# Patient Record
Sex: Female | Born: 1945 | Race: Black or African American | Hispanic: No | Marital: Married | Smoking: Former smoker
Health system: Southern US, Community
[De-identification: ages and names within clinical notes are randomized; demographics above are authoritative.]

## PROBLEM LIST (undated history)

## (undated) DIAGNOSIS — K219 Gastro-esophageal reflux disease without esophagitis: Secondary | ICD-10-CM

## (undated) DIAGNOSIS — M1990 Unspecified osteoarthritis, unspecified site: Secondary | ICD-10-CM

## (undated) DIAGNOSIS — F32A Depression, unspecified: Secondary | ICD-10-CM

## (undated) DIAGNOSIS — M549 Dorsalgia, unspecified: Secondary | ICD-10-CM

## (undated) DIAGNOSIS — F329 Major depressive disorder, single episode, unspecified: Secondary | ICD-10-CM

## (undated) DIAGNOSIS — E7800 Pure hypercholesterolemia, unspecified: Secondary | ICD-10-CM

## (undated) HISTORY — PX: NECK SURGERY: SHX720

## (undated) HISTORY — PX: CATARACT EXTRACTION: SUR2

## (undated) HISTORY — PX: ABDOMINAL HYSTERECTOMY: SHX81

## (undated) HISTORY — PX: BACK SURGERY: SHX140

---

## 2005-03-22 ENCOUNTER — Ambulatory Visit (HOSPITAL_COMMUNITY): Admission: RE | Admit: 2005-03-22 | Discharge: 2005-03-22 | Payer: Self-pay | Admitting: Family Medicine

## 2005-07-21 ENCOUNTER — Emergency Department (HOSPITAL_COMMUNITY): Admission: EM | Admit: 2005-07-21 | Discharge: 2005-07-21 | Payer: Self-pay | Admitting: Emergency Medicine

## 2005-07-26 ENCOUNTER — Emergency Department (HOSPITAL_COMMUNITY): Admission: EM | Admit: 2005-07-26 | Discharge: 2005-07-26 | Payer: Self-pay | Admitting: Emergency Medicine

## 2005-09-11 ENCOUNTER — Ambulatory Visit (HOSPITAL_COMMUNITY): Admission: RE | Admit: 2005-09-11 | Discharge: 2005-09-11 | Payer: Self-pay | Admitting: Orthopedic Surgery

## 2006-07-24 ENCOUNTER — Emergency Department (HOSPITAL_COMMUNITY): Admission: EM | Admit: 2006-07-24 | Discharge: 2006-07-24 | Payer: Self-pay | Admitting: Emergency Medicine

## 2009-09-23 ENCOUNTER — Ambulatory Visit (HOSPITAL_COMMUNITY): Admission: RE | Admit: 2009-09-23 | Discharge: 2009-09-23 | Payer: Self-pay | Admitting: Family Medicine

## 2009-10-27 ENCOUNTER — Encounter (INDEPENDENT_AMBULATORY_CARE_PROVIDER_SITE_OTHER): Payer: Self-pay | Admitting: *Deleted

## 2009-11-30 ENCOUNTER — Ambulatory Visit: Payer: Self-pay | Admitting: Gastroenterology

## 2009-11-30 DIAGNOSIS — R1013 Epigastric pain: Secondary | ICD-10-CM | POA: Insufficient documentation

## 2009-11-30 DIAGNOSIS — R630 Anorexia: Secondary | ICD-10-CM

## 2009-11-30 DIAGNOSIS — K219 Gastro-esophageal reflux disease without esophagitis: Secondary | ICD-10-CM

## 2009-11-30 DIAGNOSIS — R198 Other specified symptoms and signs involving the digestive system and abdomen: Secondary | ICD-10-CM

## 2009-11-30 DIAGNOSIS — R634 Abnormal weight loss: Secondary | ICD-10-CM

## 2009-11-30 DIAGNOSIS — Z85038 Personal history of other malignant neoplasm of large intestine: Secondary | ICD-10-CM | POA: Insufficient documentation

## 2009-12-22 ENCOUNTER — Telehealth (INDEPENDENT_AMBULATORY_CARE_PROVIDER_SITE_OTHER): Payer: Self-pay | Admitting: *Deleted

## 2010-01-06 ENCOUNTER — Ambulatory Visit (HOSPITAL_COMMUNITY): Admission: RE | Admit: 2010-01-06 | Discharge: 2010-01-06 | Payer: Self-pay | Admitting: Gastroenterology

## 2010-01-06 ENCOUNTER — Ambulatory Visit: Payer: Self-pay | Admitting: Gastroenterology

## 2010-01-12 ENCOUNTER — Encounter (INDEPENDENT_AMBULATORY_CARE_PROVIDER_SITE_OTHER): Payer: Self-pay

## 2010-04-07 ENCOUNTER — Encounter (INDEPENDENT_AMBULATORY_CARE_PROVIDER_SITE_OTHER): Payer: Self-pay | Admitting: *Deleted

## 2010-05-02 NOTE — Letter (Signed)
Summary: Patient Notice, Colon Biopsy Results  [facility]  [address]   [place], [place] [address]   Phone: [phone]  Fax: [phone]       January 12, 2010   [facility] [address] [place], [place]  [address] 1946/03/23    Dear Ms. Fayrene Fearing,  We have been unable to reach you by phone. I am pleased to inform you that the biopsies taken during your recent colonoscopy did not show any evidence of cancer upon pathologic examination. You did have some inflammation.  Additional information/recommendations:  __Continue taking your Prilosec, Benefiber and [facility]  __ Please follow a low fat / High fiber diet  __You should have a repeat colonoscopy examination  in 5 years.  Please call us if you are having persistent problems or have questions about your condition that have not been fully answered at this time.  Sincerely,    Hendricks Limes LPN  [facility] Ph: [phone]    Fax: [phone]

## 2010-05-02 NOTE — Progress Notes (Signed)
  Phone Note Call from Patient   Summary of Call: PT called to cx her procedure for tomorrow because she has had a death in the family and needs to [facility]. Initial call taken by: Diana Eves,  December 22, 2009 9:39 AM     Appended Document:  her number is [phone]  Appended Document:  I called pt to reschedule procedure 01/06/10@9 :00a.m.

## 2010-05-02 NOTE — Letter (Signed)
Summary: Unable to Reach, Consult Scheduled  [facility]  [address]   [place], [place] [address]   Phone: [phone]  Fax: [phone]    10/27/2009  [facility] [address] [place], [place]  [address] [birth year]   Dear Karina Holland,   We have been unable to reach you by phone.  Please contact our office with an updated phone number.  At the recommendation of [facility] we have been asked to schedule you a consult with DR FIELDS for ALTERNATING CONSTIPATION AND DIARRHEA, CONSULT FOR COLONOSCOPY.   Please call our office at [phone].     Thank you,    Karina Holland  [facility] R. Roetta Sessions, M.D.    Jonette Eva, M.D. Lorenza Burton, FNP-BC    Tana Coast, PA-C Phone: [phone]    Fax: [phone]

## 2010-05-02 NOTE — Letter (Signed)
Summary: TCS/EGD ORDER  TCS/EGD ORDER   Imported By: Ave Filter 11/30/2009 11:22:35  _____________________________________________________________________  External Attachment:    Type:   Image     Comment:   External Document

## 2010-05-02 NOTE — Assessment & Plan Note (Signed)
Summary: ABD PAIN,ALTERNATING DIARRHEA,CONSTIPATION,COLON CANCER,CONSU...   Visit Type:  Initial Consult Referring Provider:  Thurmond Butts Primary Care Provider:  Thurmond Butts  Chief Complaint:  Alternating diarrhea/constipation.  History of Present Illness: Karina Holland is a pleasant 65 y/o female, pateint of [name], who was referred for further evaluation of abd pain, change in stools, h/o colon cancer.  H/O colon cancer s/p resection done at [facility], 10-15 yrs ago. Not sure why she was never had another colonoscopy. Having abd pain for few months. They tried PPI at first but no better. C/O intermittent loss of appetite. States she lost from 150 to 120 pounds over the last one year but weight back up to 138 now. Some burning in throat in chest. This week started regurgitation and some vomiting. Stools are green. BM about twice per day. Stools are solid to loose. Sat/Sun real bad diarrhea. Then may have intermittent constipation as well and takes stool softner. Abd cramping and pain in mid to upper abd. No recent brbpr, melena. Eats lots of vegetables. C/O irritated hemorrhoids and associated pain.   CT A/P, 09/23/09-->tiny left renal cyst Labs 09/16/09: WBC 7500, H/H 13.1/40.4, Plt 283,000, Cre 1.21, Na 141, K 4.2, alb 4.2, Ca 9.3, Tbili 0.3, AP 73, AST 25, ALT 27.  Current Medications (verified): 1)  Multi-Vitamin .... Take 1 Tablet By Mouth Once A Day 2)  Ibuprofen 800 Mg Tabs (Ibuprofen) .... As Needed  Allergies (verified): No Known Drug Allergies  Past History:  Past Medical History: Colon cancer, around 2001, no f/u TCS done.  Cervical stenosis  Past Surgical History: Colon cancer surgery Hysterectomy for uterine cancer? Neck surgery  Family History: Mother, deceased, colon cancer Father, DM Sister, lung cancer  Social History: Separated. Three children.  Smokes about two cigs per day, trying to quit. No alcohol.  No drugs.  Review of  Systems General:  Complains of anorexia and weight loss; denies fever, chills, sweats, fatigue, and weakness. Eyes:  Denies vision loss. ENT:  Denies nasal congestion, sore throat, hoarseness, and difficulty swallowing. CV:  Denies chest pains, angina, palpitations, dyspnea on exertion, and peripheral edema. Resp:  Denies dyspnea at rest, dyspnea with exercise, cough, sputum, and wheezing. GI:  See HPI. GU:  Denies urinary burning and blood in urine. MS:  neck pain. Derm:  Denies rash and itching. Neuro:  Denies weakness, frequent headaches, memory loss, and confusion. Psych:  Complains of anxiety; denies depression. Endo:  Denies unusual weight change. Heme:  Denies bruising and bleeding. Allergy:  Denies hives and rash.  Vital Signs:  Patient profile:   65 year old female Height:      66 inches Weight:      138 pounds BMI:     22.35 Temp:     97.9 degrees F oral Pulse rate:   72 / minute BP sitting:   108 / 70  (left arm) Cuff size:   regular  Vitals Entered By: Cloria Spring LPN (November 30, 2009 10:19 AM)  Physical Exam  General:  Well developed, well nourished, no acute distress. Head:  Normocephalic and atraumatic. Eyes:  Conjunctivae pink, no scleral icterus.  Mouth:  Oropharyngeal mucosa moist, pink.  No lesions, erythema or exudate.    Neck:  Supple; no masses or thyromegaly. Lungs:  Clear throughout to auscultation. Heart:  Regular rate and rhythm; no murmurs, rubs,  or bruits. Abdomen:  Normal BS. Moderate epig tenderness. No rebound or guarding. No abd bruit or hernia. No HSM or  masses.  Rectal:  deferred until time of colonoscopy.   Extremities:  No clubbing, cyanosis, edema or deformities noted. Neurologic:  Alert and  oriented x4;  grossly normal neurologically. Skin:  Intact without significant lesions or rashes. Cervical Nodes:  No significant cervical adenopathy. Psych:  Alert and cooperative. Normal mood and affect.  Impression &  Recommendations:  Problem # 1:  ADENOCARCINOMA, COLON, HX OF (ICD-V10.05)  H/O remote colon cancer per patient. No subsequent colonoscopy. Now with change in stools with alternating constipation and diarrhea (mostly soft/loose stools).  Recent CT unrevealing. Labs good. Recommend colonoscopy. Colonoscopy to be performed in near future.  Risks, alternatives, and benefits including but not limited to the risk of reaction to medication, bleeding, infection, and perforation were addressed.  Patient voiced understanding and provided verbal consent.   C/O irritated hemorrhoids.  Orders: Consultation Level III (56213)  Problem # 2:  ABDOMINAL PAIN, EPIGASTRIC (ICD-789.06)  Epigastric pain for several months, associated with intermittent anorexia and weight loss, also with GERD symptoms. Trial of omeprazole and Nexium did not help. She has used intermittent NSAIDS including Voltaren and ibuprofen. EGD to be performed in near future.  Risks, alternatives, benefits including but not limited to risk of reaction to medications, bleeding, infection, and perforation addressed.  Patient voiced understanding and verbal consent obtained.   Orders: Consultation Level III (08657) Prescriptions: LIDOCAINE-HYDROCORTISONE ACE 3-0.5 % CREA (LIDOCAINE-HYDROCORTISONE ACE) apply anorectally two times a day for 14 days  #14 days x 0   Entered and Authorized by:   Leanna Battles. Dixon Boos   Signed by:   Leanna Battles Lewis PA-C on 11/30/2009   Method used:   Print then Give to Patient   RxID:   [id number]  I would like to thank Dixie Dials, NP for allowing [place] to take part in the care of this nice patient.  Appended Document: ABD PAIN,ALTERNATING DIARRHEA,CONSTIPATION,COLON CANCER,CONSU... NEEDS PED SCOPE.  Appended Document: ABD PAIN,ALTERNATING DIARRHEA,CONSTIPATION,COLON CANCER,CONSU... CM added peds scope to order  Appended Document: ABD PAIN,ALTERNATING DIARRHEA,CONSTIPATION,COLON CANCER,CONSU... JUNE 2011:  BMI 21.15 135 LBS ETOH? [facility] NOTES YES/ [facility] NO

## 2010-05-04 NOTE — Letter (Signed)
Summary: Recall Office Visit  [facility]  [address]   [place], [place] [address]   Phone: [phone]  Fax: [phone]      April 07, 2010   [facility] [address] [place], [place]  [address] 03-23-1946   Dear Karina Holland,   According to our records, it is time for you to schedule a follow-up office visit with [place].   At your convenience, please call [phone] to schedule an office visit. If you have any questions, concerns, or feel that this letter is in error, we would appreciate your call.   Sincerely,    Diana Eves  [facility] Ph: [phone]   Fax: [phone]

## 2010-05-15 ENCOUNTER — Emergency Department (HOSPITAL_COMMUNITY): Payer: Medicaid Other

## 2010-05-15 ENCOUNTER — Emergency Department (HOSPITAL_COMMUNITY)
Admission: EM | Admit: 2010-05-15 | Discharge: 2010-05-15 | Disposition: A | Discharge status: Home / Self Care | Payer: Medicaid Other | Attending: Emergency Medicine | Admitting: Emergency Medicine

## 2010-05-15 DIAGNOSIS — J40 Bronchitis, not specified as acute or chronic: Secondary | ICD-10-CM | POA: Insufficient documentation

## 2010-05-15 DIAGNOSIS — F411 Generalized anxiety disorder: Secondary | ICD-10-CM | POA: Insufficient documentation

## 2010-05-15 DIAGNOSIS — R079 Chest pain, unspecified: Secondary | ICD-10-CM | POA: Insufficient documentation

## 2010-05-15 DIAGNOSIS — R059 Cough, unspecified: Secondary | ICD-10-CM | POA: Insufficient documentation

## 2010-05-15 DIAGNOSIS — R911 Solitary pulmonary nodule: Secondary | ICD-10-CM | POA: Insufficient documentation

## 2010-05-15 DIAGNOSIS — M129 Arthropathy, unspecified: Secondary | ICD-10-CM | POA: Insufficient documentation

## 2010-05-15 DIAGNOSIS — R05 Cough: Secondary | ICD-10-CM | POA: Insufficient documentation

## 2010-05-15 DIAGNOSIS — F17200 Nicotine dependence, unspecified, uncomplicated: Secondary | ICD-10-CM | POA: Insufficient documentation

## 2010-05-15 DIAGNOSIS — J3489 Other specified disorders of nose and nasal sinuses: Secondary | ICD-10-CM | POA: Insufficient documentation

## 2011-02-14 ENCOUNTER — Emergency Department (HOSPITAL_COMMUNITY)
Admission: EM | Admit: 2011-02-14 | Discharge: 2011-02-14 | Disposition: A | Discharge status: Home / Self Care | Payer: Medicaid Other | Attending: Emergency Medicine | Admitting: Emergency Medicine

## 2011-02-14 ENCOUNTER — Encounter: Payer: Self-pay | Admitting: *Deleted

## 2011-02-14 DIAGNOSIS — R109 Unspecified abdominal pain: Secondary | ICD-10-CM | POA: Insufficient documentation

## 2011-02-14 DIAGNOSIS — R10815 Periumbilic abdominal tenderness: Secondary | ICD-10-CM | POA: Insufficient documentation

## 2011-02-14 DIAGNOSIS — K529 Noninfective gastroenteritis and colitis, unspecified: Secondary | ICD-10-CM

## 2011-02-14 DIAGNOSIS — R10816 Epigastric abdominal tenderness: Secondary | ICD-10-CM | POA: Insufficient documentation

## 2011-02-14 DIAGNOSIS — R1110 Vomiting, unspecified: Secondary | ICD-10-CM | POA: Insufficient documentation

## 2011-02-14 DIAGNOSIS — R197 Diarrhea, unspecified: Secondary | ICD-10-CM | POA: Insufficient documentation

## 2011-02-14 DIAGNOSIS — IMO0001 Reserved for inherently not codable concepts without codable children: Secondary | ICD-10-CM | POA: Insufficient documentation

## 2011-02-14 DIAGNOSIS — Z87891 Personal history of nicotine dependence: Secondary | ICD-10-CM | POA: Insufficient documentation

## 2011-02-14 DIAGNOSIS — Z85038 Personal history of other malignant neoplasm of large intestine: Secondary | ICD-10-CM | POA: Insufficient documentation

## 2011-02-14 LAB — LIPASE, BLOOD: Lipase: 34 U/L (ref 11–59)

## 2011-02-14 LAB — DIFFERENTIAL
Basophils Absolute: 0 10*3/uL (ref 0.0–0.1)
Basophils Relative: 0 % (ref 0–1)
Eosinophils Absolute: 0 10*3/uL (ref 0.0–0.7)
Eosinophils Relative: 0 % (ref 0–5)
Lymphs Abs: 0.7 10*3/uL (ref 0.7–4.0)
Neutro Abs: 6.9 10*3/uL (ref 1.7–7.7)
Neutrophils Relative %: 87 % — ABNORMAL HIGH (ref 43–77)

## 2011-02-14 LAB — HEPATIC FUNCTION PANEL
ALT: 21 U/L (ref 0–35)
AST: 24 U/L (ref 0–37)
Bilirubin, Direct: 0.1 mg/dL (ref 0.0–0.3)
Indirect Bilirubin: 0.3 mg/dL (ref 0.3–0.9)
Total Protein: 8 g/dL (ref 6.0–8.3)

## 2011-02-14 LAB — BASIC METABOLIC PANEL
BUN: 12 mg/dL (ref 6–23)
Calcium: 9.8 mg/dL (ref 8.4–10.5)
Chloride: 101 mEq/L (ref 96–112)
GFR calc Af Amer: 61 mL/min — ABNORMAL LOW (ref >90)
GFR calc non Af Amer: 52 mL/min — ABNORMAL LOW (ref >90)
Potassium: 3.5 mEq/L (ref 3.5–5.1)
Sodium: 137 mEq/L (ref 135–145)

## 2011-02-14 LAB — URINALYSIS, ROUTINE W REFLEX MICROSCOPIC
Bilirubin Urine: NEGATIVE
Nitrite: NEGATIVE
Protein, ur: NEGATIVE mg/dL
Specific Gravity, Urine: 1.025 (ref 1.005–1.030)
Urobilinogen, UA: 0.2 mg/dL (ref 0.0–1.0)
pH: 6 (ref 5.0–8.0)

## 2011-02-14 LAB — URINE MICROSCOPIC-ADD ON

## 2011-02-14 LAB — CBC
MCH: 27.9 pg (ref 26.0–34.0)
MCV: 87.3 fL (ref 78.0–100.0)
Platelets: 282 10*3/uL (ref 150–400)
RBC: 5.05 MIL/uL (ref 3.87–5.11)
RDW: 12.8 % (ref 11.5–15.5)

## 2011-02-14 MED ORDER — SODIUM CHLORIDE 0.9 % IV SOLN
Freq: Once | INTRAVENOUS | Status: AC
  Administered 2011-02-14: 1000 mL via INTRAVENOUS

## 2011-02-14 MED ORDER — ONDANSETRON HCL 4 MG/2ML IJ SOLN
4.0000 mg | Freq: Once | INTRAMUSCULAR | Status: AC
  Administered 2011-02-14: 4 mg via INTRAVENOUS
  Filled 2011-02-14: qty 2

## 2011-02-14 MED ORDER — KETOROLAC TROMETHAMINE 30 MG/ML IJ SOLN
30.0000 mg | Freq: Once | INTRAMUSCULAR | Status: AC
  Administered 2011-02-14: 30 mg via INTRAVENOUS
  Filled 2011-02-14: qty 1

## 2011-02-14 MED ORDER — PROMETHAZINE HCL 25 MG PO TABS: 25.0000 mg | ORAL_TABLET | Freq: Four times a day (QID) | ORAL | Status: AC | PRN

## 2011-02-14 NOTE — ED Notes (Signed)
Pt c/o n/v/d and generalized body aches since last night

## 2011-02-14 NOTE — ED Provider Notes (Signed)
History  This chart was scribed for Geoffery Lyons, MD by Clarita Crane. The patient was seen in room APA14/APA14 and the patient's care was started at 10:24AM.  CSN: [id number] Arrival date & time: 02/14/2011  9:25 AM   First MD Initiated Contact with Patient 02/14/11 0957      Chief Complaint  Patient presents with  . Emesis  . Abdominal Pain  . Generalized Body Aches  . Diarrhea     The history is provided by the patient.   Karina Holland is a 65 y.o. female who presents to the Emergency Department complaining of diarrhea and vomiting that stared last night. She stated that she has had similar symptoms before but that previous episodes were much more mild.  She stated that the first time she vomited, she had mild hematemesis but that there has been no further hematemesis since that incident. She denies hematochezia and recent sick contacts. She also stated that she took Pepto bismol but that she vomited shortly after. She confirmed that she had colon cancer about 10 to 15 years ago, but that the cancer has been in remission since. She denied having a history of diabetes or being on any current medications.   History reviewed. No pertinent past medical history.  Past Surgical History  Procedure Date  . Neck surgery   . Abdominal hysterectomy     History reviewed. No pertinent family history.  History  Substance Use Topics  . Smoking status: Former Games developer  . Smokeless tobacco: Not on file  . Alcohol Use: No    OB History    Grav Para Term Preterm Abortions TAB SAB Ect Mult Living                  Review of Systems 10 Systems reviewed and are negative for acute change except as noted in the HPI.  Allergies  Review of patient's allergies indicates no known allergies.  Home Medications   Current Outpatient Rx  Name Route Sig Dispense Refill  . BISMUTH SUBSALICYLATE 262 MG/15ML PO SUSP Oral Take 30 mLs by mouth every 6 (six) hours as needed. Stomach Pain     .  ONE-A-DAY ENERGY PO Oral Take 1 tablet by mouth daily.        BP 104/67  Pulse 108  Temp(Src) 98 F (36.7 C) (Oral)  Resp 18  Ht 5\' 6"  (1.676 m)  Wt 140 lb (63.504 kg)  BMI 22.60 kg/m2  SpO2 99%  Physical Exam  Constitutional: She is oriented to person, place, and time. She appears well-developed and well-nourished. No distress.  HENT:  Head: Normocephalic and atraumatic.  Eyes: EOM are normal.  Neck: Neck supple. No tracheal deviation present.  Cardiovascular: Normal rate and regular rhythm.   Pulmonary/Chest: Effort normal and breath sounds normal. No respiratory distress.  Abdominal: There is tenderness (mild tenderness to palpation in the epigastric and periumbilical regions). There is no rebound and no guarding.       No CVA tenderness  Musculoskeletal: Normal range of motion.  Neurological: She is alert and oriented to person, place, and time. No cranial nerve deficit.  Skin: Skin is warm and dry.  Psychiatric: She has a normal mood and affect. Her behavior is normal.    ED Course  Procedures (including critical care time)  DIAGNOSTIC STUDIES: Oxygen Saturation is 98% on room air, normal by my interpretation.    COORDINATION OF CARE: 10:24AM- Patient asked for flu shot.  10:26AM- Discussed treatment plan and blood  draw order with patient at bedside and patient agreed to plan  Labs Reviewed  BASIC METABOLIC PANEL - Abnormal; Notable for the following:    Glucose, Bld 130 (*)    GFR calc non Af Amer 52 (*)    GFR calc Af Amer 61 (*)    All other components within normal limits  URINALYSIS, ROUTINE W REFLEX MICROSCOPIC - Abnormal; Notable for the following:    Leukocytes, UA TRACE (*)    All other components within normal limits  DIFFERENTIAL - Abnormal; Notable for the following:    Neutrophils Relative 87 (*)    Lymphocytes Relative 9 (*)    All other components within normal limits  URINE MICROSCOPIC-ADD ON - Abnormal; Notable for the following:    Squamous  Epithelial / LPF MANY (*)    Bacteria, UA MANY (*)    All other components within normal limits  CBC  LIPASE, BLOOD  HEPATIC FUNCTION PANEL   No results found.   No diagnosis found.    MDM  Labs look fine.  This is most likely viral gastroenteritis.  Will give phenergan, time.  F/U prn.     I personally performed the services described in this documentation, which was scribed in my presence. The recorded information has been reviewed and considered.      Geoffery Lyons, MD 02/14/11 [phone]

## 2011-02-14 NOTE — Discharge Instructions (Signed)
Viral Gastroenteritis Viral gastroenteritis is also known as stomach flu. This condition affects the stomach and intestinal tract. The illness typically lasts 3 to 8 days. Most people develop an immune response. This eventually gets rid of the virus. While this natural response develops, the virus can make you quite ill.  CAUSES  Diarrhea and vomiting are often caused by a virus. Medicines (antibiotics) that kill germs will not help unless there is also a germ (bacterial) infection. SYMPTOMS  The most common symptom is diarrhea. This can cause severe loss of fluids (dehydration) and body salt (electrolyte) imbalance. TREATMENT  Treatments for this illness are aimed at rehydration. Antidiarrheal medicines are not recommended. They do not decrease diarrhea volume and may be harmful. Usually, home treatment is all that is needed. The most serious cases involve vomiting so severely that you are not able to keep down fluids taken by mouth (orally). In these cases, intravenous (IV) fluids are needed. Vomiting with viral gastroenteritis is common, but it will usually go away with treatment. HOME CARE INSTRUCTIONS  Small amounts of fluids should be taken frequently. Large amounts at one time may not be tolerated. Plain water may be harmful in infants and the elderly. Oral rehydration solutions (ORS) are available at pharmacies and grocery stores. ORS replace water and important electrolytes in proper proportions. Sports drinks are not as effective as ORS and may be harmful due to sugars worsening diarrhea.  As a general guideline for children, replace any new fluid losses from diarrhea or vomiting with ORS as follows:   If your child weighs 22 pounds or under (10 kg or less), give 60-120 mL (1/4 - 1/2 cup or 2 - 4 ounces) of ORS for each diarrheal stool or vomiting episode.   If your child weighs more than 22 pounds (more than 10 kgs), give 120-240 mL (1/2 - 1 cup or 4 - 8 ounces) of ORS for each diarrheal  stool or vomiting episode.   In a child with vomiting, it may be helpful to give the above ORS replacement in 5 mL (1 teaspoon) amounts every 5 minutes, then increase as tolerated.   While correcting for dehydration, children should eat normally. However, foods high in sugar should be avoided because this may worsen diarrhea. Large amounts of carbonated soft drinks, juice, gelatin desserts, and other highly sugared drinks should be avoided.   After correction of dehydration, other liquids that are appealing to the child may be added. Children should drink small amounts of fluids frequently and fluids should be increased as tolerated.   Adults should eat normally while drinking more fluids than usual. Drink small amounts of fluids frequently and increase as tolerated. Drink enough water and fluids to keep your urine clear or pale yellow. Broths, weak decaffeinated tea, lemon-lime soft drinks (allowed to go flat), and ORS replace fluids and electrolytes.   Avoid:   Carbonated drinks.   Juice.   Extremely hot or cold fluids.   Caffeine drinks.   Fatty, greasy foods.   Alcohol.   Tobacco.   Too much intake of anything at one time.   Gelatin desserts.   Probiotics are active cultures of beneficial bacteria. They may lessen the amount and number of diarrheal stools in adults. Probiotics can be found in yogurt with active cultures and in supplements.   Wash your hands well to avoid spreading bacteria and viruses.   Antidiarrheal medicines are not recommended for infants and children.   Only take over-the-counter or prescription medicines for   pain, discomfort, or fever as directed by your caregiver. Do not give aspirin to children.   For adults with dehydration, ask your caregiver if you should continue all prescribed and over-the-counter medicines.   If your caregiver has given you a follow-up appointment, it is very important to keep that appointment. Not keeping the appointment  could result in a lasting (chronic) or permanent injury and disability. If there is any problem keeping the appointment, you must call to reschedule.  SEEK IMMEDIATE MEDICAL CARE IF:   You are unable to keep fluids down.   There is no urine output in 6 to 8 hours or there is only a small amount of very dark urine.   You develop shortness of breath.   There is blood in the vomit (may look like coffee grounds) or stool.   Belly (abdominal) pain develops, increases, or localizes.   There is persistent vomiting or diarrhea.   You have a fever.   Your baby is older than 3 months with a rectal temperature of 102 F (38.9 C) or higher.   Your baby is 3 months old or younger with a rectal temperature of 100.4 F (38 C) or higher.  MAKE SURE YOU:   Understand these instructions.   Will watch your condition.   Will get help right away if you are not doing well or get worse.  Document Released: 03/19/2005 Document Revised: 11/29/2010 Document Reviewed: 07/31/2006 Exit[employer] Patient Information 2012 [employer]. 

## 2013-01-07 ENCOUNTER — Other Ambulatory Visit (HOSPITAL_COMMUNITY): Payer: Self-pay | Admitting: Family Medicine

## 2013-01-07 DIAGNOSIS — Z139 Encounter for screening, unspecified: Secondary | ICD-10-CM

## 2013-01-09 ENCOUNTER — Ambulatory Visit (HOSPITAL_COMMUNITY): Payer: Medicaid Other

## 2013-01-13 ENCOUNTER — Ambulatory Visit (HOSPITAL_COMMUNITY): Payer: Medicaid Other

## 2013-01-16 ENCOUNTER — Ambulatory Visit (HOSPITAL_COMMUNITY): Payer: Medicaid Other

## 2013-01-23 ENCOUNTER — Ambulatory Visit (HOSPITAL_COMMUNITY): Payer: Medicaid Other

## 2013-01-30 ENCOUNTER — Ambulatory Visit (HOSPITAL_COMMUNITY)
Admission: RE | Admit: 2013-01-30 | Discharge: 2013-01-30 | Disposition: A | Discharge status: Home / Self Care | Payer: Medicaid Other | Source: Ambulatory Visit | Attending: Family Medicine | Admitting: Family Medicine

## 2013-01-30 DIAGNOSIS — Z1231 Encounter for screening mammogram for malignant neoplasm of breast: Secondary | ICD-10-CM | POA: Insufficient documentation

## 2013-01-30 DIAGNOSIS — Z139 Encounter for screening, unspecified: Secondary | ICD-10-CM

## 2013-03-05 ENCOUNTER — Other Ambulatory Visit (HOSPITAL_COMMUNITY): Payer: Self-pay | Admitting: Family Medicine

## 2013-03-05 DIAGNOSIS — R140 Abdominal distension (gaseous): Secondary | ICD-10-CM

## 2013-03-05 DIAGNOSIS — R198 Other specified symptoms and signs involving the digestive system and abdomen: Secondary | ICD-10-CM

## 2013-03-11 ENCOUNTER — Ambulatory Visit (HOSPITAL_COMMUNITY): Admission: RE | Admit: 2013-03-11 | Payer: PRIVATE HEALTH INSURANCE | Source: Ambulatory Visit

## 2013-03-11 ENCOUNTER — Ambulatory Visit (HOSPITAL_COMMUNITY): Payer: PRIVATE HEALTH INSURANCE

## 2013-07-20 ENCOUNTER — Ambulatory Visit (HOSPITAL_COMMUNITY): Payer: PRIVATE HEALTH INSURANCE | Attending: Family Medicine

## 2013-07-20 ENCOUNTER — Ambulatory Visit (HOSPITAL_COMMUNITY): Payer: PRIVATE HEALTH INSURANCE

## 2013-07-27 ENCOUNTER — Ambulatory Visit (HOSPITAL_COMMUNITY): Payer: PRIVATE HEALTH INSURANCE

## 2013-07-31 ENCOUNTER — Ambulatory Visit (HOSPITAL_COMMUNITY): Payer: PRIVATE HEALTH INSURANCE

## 2013-08-05 ENCOUNTER — Ambulatory Visit (HOSPITAL_COMMUNITY): Payer: PRIVATE HEALTH INSURANCE | Attending: Family Medicine

## 2013-08-10 ENCOUNTER — Ambulatory Visit (HOSPITAL_COMMUNITY): Admission: RE | Admit: 2013-08-10 | Payer: PRIVATE HEALTH INSURANCE | Source: Ambulatory Visit

## 2013-08-10 ENCOUNTER — Ambulatory Visit (HOSPITAL_COMMUNITY)
Admission: RE | Admit: 2013-08-10 | Discharge: 2013-08-10 | Disposition: A | Discharge status: Home / Self Care | Payer: PRIVATE HEALTH INSURANCE | Source: Ambulatory Visit | Attending: Family Medicine | Admitting: Family Medicine

## 2013-08-10 DIAGNOSIS — R141 Gas pain: Secondary | ICD-10-CM | POA: Insufficient documentation

## 2013-08-10 DIAGNOSIS — R109 Unspecified abdominal pain: Secondary | ICD-10-CM | POA: Insufficient documentation

## 2013-08-10 DIAGNOSIS — R140 Abdominal distension (gaseous): Secondary | ICD-10-CM

## 2013-08-10 DIAGNOSIS — R143 Flatulence: Secondary | ICD-10-CM

## 2013-08-10 DIAGNOSIS — R142 Eructation: Secondary | ICD-10-CM

## 2013-08-10 DIAGNOSIS — R198 Other specified symptoms and signs involving the digestive system and abdomen: Secondary | ICD-10-CM | POA: Insufficient documentation

## 2013-08-27 ENCOUNTER — Emergency Department (HOSPITAL_COMMUNITY)
Admission: EM | Admit: 2013-08-27 | Discharge: 2013-08-28 | Disposition: A | Discharge status: Home / Self Care | Payer: PRIVATE HEALTH INSURANCE | Attending: Emergency Medicine | Admitting: Emergency Medicine

## 2013-08-27 DIAGNOSIS — Z87891 Personal history of nicotine dependence: Secondary | ICD-10-CM | POA: Insufficient documentation

## 2013-08-27 DIAGNOSIS — Z8639 Personal history of other endocrine, nutritional and metabolic disease: Secondary | ICD-10-CM | POA: Insufficient documentation

## 2013-08-27 DIAGNOSIS — Z862 Personal history of diseases of the blood and blood-forming organs and certain disorders involving the immune mechanism: Secondary | ICD-10-CM | POA: Insufficient documentation

## 2013-08-27 DIAGNOSIS — M542 Cervicalgia: Secondary | ICD-10-CM | POA: Insufficient documentation

## 2013-08-27 DIAGNOSIS — J40 Bronchitis, not specified as acute or chronic: Secondary | ICD-10-CM

## 2013-08-27 DIAGNOSIS — Z8719 Personal history of other diseases of the digestive system: Secondary | ICD-10-CM | POA: Insufficient documentation

## 2013-08-27 HISTORY — DX: Gastro-esophageal reflux disease without esophagitis: K21.9

## 2013-08-27 HISTORY — DX: Dorsalgia, unspecified: M54.9

## 2013-08-27 HISTORY — DX: Pure hypercholesterolemia, unspecified: E78.00

## 2013-08-28 ENCOUNTER — Encounter (HOSPITAL_COMMUNITY): Payer: Self-pay | Admitting: Emergency Medicine

## 2013-08-28 ENCOUNTER — Emergency Department (HOSPITAL_COMMUNITY): Payer: PRIVATE HEALTH INSURANCE

## 2013-08-28 MED ORDER — BENZONATATE 100 MG PO CAPS: 100.0000 mg | ORAL_CAPSULE | Freq: Three times a day (TID) | ORAL | Status: DC

## 2013-08-28 MED ORDER — BENZONATATE 100 MG PO CAPS
200.0000 mg | ORAL_CAPSULE | Freq: Once | ORAL | Status: AC
  Administered 2013-08-28: 200 mg via ORAL
  Filled 2013-08-28: qty 2

## 2013-08-28 NOTE — ED Notes (Signed)
Patient reports productive cough x 3 days with yellow sputum. Also complains of headache, sore throat, and vomiting and diarrhea yesterday. Patient smells of ETOH.

## 2013-08-28 NOTE — Discharge Instructions (Signed)
Please take your albuterol inhaler 2 puffs every 4 hours for 24 hours then 2 puffs every 4 hours as needed. Take the cough medicine as prescribed, followup with her doctor in the next 3 days, her chest x-ray shows no signs of pneumonia.  Please call your doctor for a followup appointment within 24-48 hours. When you talk to your doctor please let them know that you were seen in the emergency department and have them acquire all of your records so that they can discuss the findings with you and formulate a treatment plan to fully care for your new and ongoing problems.

## 2013-08-28 NOTE — ED Provider Notes (Signed)
CSN: [id number]     Arrival date & time 08/27/13  2357 History   First MD Initiated Contact with Patient 08/28/13 0037 This chart was scribed for Karina Acosta, MD by Anastasia Pall, ED Scribe. This patient was seen in room APA11/APA11 and the patient's care was started at 12:39 AM.   Chief Complaint  Patient presents with  . Cough   (Consider location/radiation/quality/duration/timing/severity/associated sxs/prior Treatment) The history is provided by the patient. No language interpreter was used.   HPI Comments: Karina Holland is a 68 y.o. female who presents to the Emergency Department complaining of cough, onset 3 days ago, worse in the evenings while laying down, intermittently productive of sputum, and with associated chest pain and neck pain. She has associated congestion, sore throat, headache. She has tried Robitussin without relief. She has Albuterol inhaler at home she has used without relief. She has appointment in [place] tomorrow for some unexplained numbness in her L arm - unchanged for some time. She denies fever, ear pain, and any other associated symptoms. She reports being former smoker.    PCP - Tula Nakayama, MD'  Past Medical History  Diagnosis Date  . Hypercholesteremia   . GERD (gastroesophageal reflux disease)   . Back pain    Past Surgical History  Procedure Laterality Date  . Neck surgery    . Abdominal hysterectomy     History reviewed. No pertinent family history. History  Substance Use Topics  . Smoking status: Former Research scientist (life sciences)  . Smokeless tobacco: Not on file  . Alcohol Use: Yes     Comment: patient reports doesn't normally drink alcohol, drank "bootleg" tonight.   OB History   Grav Para Term Preterm Abortions TAB SAB Ect Mult Living                 Review of Systems  All other systems reviewed and are negative.   Allergies  Review of patient's allergies indicates no known allergies.  Home Medications   Prior to Admission medications    Medication Sig Start Date End Date Taking? Authorizing Provider  bismuth subsalicylate (PEPTO BISMOL) 262 MG/15ML suspension Take 30 mLs by mouth every 6 (six) hours as needed. Stomach Pain     Historical Provider, MD  Multiple Vitamins-Minerals (ONE-A-DAY ENERGY PO) Take 1 tablet by mouth daily.      Historical Provider, MD   BP 122/93  Pulse 90  Temp(Src) 97.7 F (36.5 C) (Oral)  Resp 18  Ht 5\' 6"  (1.676 m)  Wt 147 lb (66.679 kg)  BMI 23.74 kg/m2  SpO2 97% Physical Exam  Nursing note and vitals reviewed. Constitutional: She is oriented to person, place, and time. She appears well-developed and well-nourished. No distress.  HENT:  Head: Normocephalic and atraumatic.  Mouth/Throat: Oropharynx is clear and moist. No oropharyngeal exudate.  Eyes: Conjunctivae and EOM are normal. Pupils are equal, round, and reactive to light. Right eye exhibits no discharge. Left eye exhibits no discharge. No scleral icterus.  Neck: Normal range of motion. Neck supple. No JVD present. No tracheal deviation present. No thyromegaly present.  Cardiovascular: Normal rate, regular rhythm, normal heart sounds and intact distal pulses.  Exam reveals no gallop and no friction rub.   No murmur heard. Pulmonary/Chest: Effort normal and breath sounds normal. No respiratory distress. She has no wheezes. She has no rales. She exhibits no tenderness.  Abdominal: Soft. Bowel sounds are normal. She exhibits no distension and no mass. There is no tenderness.  Musculoskeletal: Normal  range of motion. She exhibits no edema and no tenderness.  Lymphadenopathy:    She has no cervical adenopathy.  Neurological: She is alert and oriented to person, place, and time. Coordination normal.  Skin: Skin is warm and dry. No rash noted. No erythema.  Psychiatric: She has a normal mood and affect. Her behavior is normal.    ED Course  Procedures (including critical care time)  DIAGNOSTIC STUDIES: Oxygen Saturation is 97% on  room air, normal by my interpretation.    COORDINATION OF CARE: 12:43 AM-Discussed treatment plan with pt at bedside and pt agreed to plan.   Labs Review Labs Reviewed - No data to display  Imaging Review Dg Chest 2 View  08/28/2013   CLINICAL DATA:  Cough for 3 days, mid chest pain, numbness LEFT arm, history colon cancer  EXAM: CHEST  2 VIEW  COMPARISON:  05/15/2010  FINDINGS: Upper normal heart size.  Normal mediastinal contours and pulmonary vascularity.  Emphysematous changes without infiltrate, pleural effusion or pneumothorax.  No definite pulmonary mass/nodule.  Minimal dextro convex thoracolumbar scoliosis.  No acute osseous findings.  IMPRESSION: Emphysematous changes.  No acute abnormalities.   Electronically Signed   By: Lavonia Dana M.D.   On: 08/28/2013 01:06     Medications  benzonatate (TESSALON) capsule 200 mg (not administered)   MDM   Final diagnoses:  Bronchitis    The patient has a normal lung exam, no wheezing, no hypoxia and normal vital signs including no fever tachycardia or hypotension. I personally reviewed and interpreted her two-view chest x-ray PA and lateral views and find there to be no infiltrates pneumothorax or abnormal mediastinum or soft tissues. The patient likely has bronchitis and will be treated with ongoing use of albuterol inhaler with an antitussive. She does not require antibiotics as this is likely viral.  Meds given in ED:  Medications  benzonatate (TESSALON) capsule 200 mg (not administered)    New Prescriptions   BENZONATATE (TESSALON) 100 MG CAPSULE    Take 1 capsule (100 mg total) by mouth every 8 (eight) hours.      I personally performed the services described in this documentation, which was scribed in my presence. The recorded information has been reviewed and is accurate.      Karina Acosta, MD 08/28/13 0111

## 2013-10-27 ENCOUNTER — Encounter: Payer: Self-pay | Admitting: *Deleted

## 2013-11-30 ENCOUNTER — Ambulatory Visit: Payer: PRIVATE HEALTH INSURANCE | Admitting: Gastroenterology

## 2013-11-30 ENCOUNTER — Telehealth: Payer: Self-pay | Admitting: Gastroenterology

## 2013-11-30 NOTE — Telephone Encounter (Signed)
Please send letter.

## 2013-11-30 NOTE — Telephone Encounter (Signed)
Pt was a no show

## 2013-12-01 ENCOUNTER — Encounter: Payer: Self-pay | Admitting: Gastroenterology

## 2013-12-01 NOTE — Telephone Encounter (Signed)
Mailed letter °

## 2014-12-08 ENCOUNTER — Encounter: Payer: Self-pay | Admitting: Gastroenterology

## 2015-03-08 ENCOUNTER — Other Ambulatory Visit (HOSPITAL_COMMUNITY): Payer: Self-pay | Admitting: Family Medicine

## 2015-03-08 DIAGNOSIS — M810 Age-related osteoporosis without current pathological fracture: Secondary | ICD-10-CM

## 2015-03-08 DIAGNOSIS — Z1231 Encounter for screening mammogram for malignant neoplasm of breast: Secondary | ICD-10-CM

## 2015-03-16 ENCOUNTER — Ambulatory Visit (HOSPITAL_COMMUNITY): Payer: Medicaid Other

## 2015-03-16 ENCOUNTER — Other Ambulatory Visit (HOSPITAL_COMMUNITY): Payer: Medicaid Other

## 2015-08-26 ENCOUNTER — Other Ambulatory Visit (HOSPITAL_COMMUNITY): Payer: Self-pay | Admitting: Internal Medicine

## 2015-08-26 DIAGNOSIS — Z1231 Encounter for screening mammogram for malignant neoplasm of breast: Secondary | ICD-10-CM

## 2015-08-31 ENCOUNTER — Other Ambulatory Visit (HOSPITAL_COMMUNITY): Payer: Self-pay | Admitting: Internal Medicine

## 2015-08-31 DIAGNOSIS — R609 Edema, unspecified: Secondary | ICD-10-CM

## 2015-09-01 ENCOUNTER — Ambulatory Visit (HOSPITAL_COMMUNITY): Payer: Medicaid Other

## 2015-09-05 ENCOUNTER — Emergency Department (HOSPITAL_COMMUNITY): Payer: Medicare Other

## 2015-09-05 ENCOUNTER — Encounter (HOSPITAL_COMMUNITY): Payer: Self-pay | Admitting: Emergency Medicine

## 2015-09-05 ENCOUNTER — Emergency Department (HOSPITAL_COMMUNITY)
Admission: EM | Admit: 2015-09-05 | Discharge: 2015-09-05 | Disposition: A | Discharge status: Home / Self Care | Payer: Medicare Other | Attending: Emergency Medicine | Admitting: Emergency Medicine

## 2015-09-05 DIAGNOSIS — Z7982 Long term (current) use of aspirin: Secondary | ICD-10-CM | POA: Insufficient documentation

## 2015-09-05 DIAGNOSIS — Z79891 Long term (current) use of opiate analgesic: Secondary | ICD-10-CM | POA: Insufficient documentation

## 2015-09-05 DIAGNOSIS — D179 Benign lipomatous neoplasm, unspecified: Secondary | ICD-10-CM | POA: Insufficient documentation

## 2015-09-05 DIAGNOSIS — Z79899 Other long term (current) drug therapy: Secondary | ICD-10-CM | POA: Diagnosis not present

## 2015-09-05 DIAGNOSIS — Z87891 Personal history of nicotine dependence: Secondary | ICD-10-CM | POA: Insufficient documentation

## 2015-09-05 DIAGNOSIS — M25551 Pain in right hip: Secondary | ICD-10-CM

## 2015-09-05 MED ORDER — HYDROCODONE-ACETAMINOPHEN 5-325 MG PO TABS: ORAL_TABLET | ORAL | Status: DC

## 2015-09-05 NOTE — ED Notes (Signed)
Pt c/o right hip pain x 7 months. Pt reports feeling "knot" to right hip/buttocks and muscle cramp to right leg last night.

## 2015-09-05 NOTE — Discharge Instructions (Signed)

## 2015-09-05 NOTE — ED Provider Notes (Signed)
History  By signing my name below, I, Karina Holland, attest that this documentation has been prepared under the direction and in the presence of Karina Zettler, PA-C. Electronically Signed: Bea Holland, ED Scribe. 09/05/2015. 2:24 PM.  Chief Complaint  Patient presents with  . Hip Pain   The history is provided by the patient and medical records. No language interpreter was used.    HPI Comments:  Karina Holland is a 70 y.o. female with PMHx of chronic back and right hip pain who presents to the Emergency Department complaining of right hip pain that has been ongoing for the past seven months. She states the pain radiates down her RLE and is causing cramping in her right calf for the past week. She reports a knot to the right buttock and describes the pain as soreness that is worse with movement. She states she has had X-Rays in the past but has not found a source of her pain. Pt reports having an appt with a new PCP this week. She has not taken anything for pain. Walking increases her pain. She denies alleviating factors. She denies numbness, tingling or weakness of the RLE, bruising, wounds, nausea, vomiting, fever or chills. No previous DVT's  Past Medical History  Diagnosis Date  . Hypercholesteremia   . GERD (gastroesophageal reflux disease)   . Back pain    Past Surgical History  Procedure Laterality Date  . Neck surgery    . Abdominal hysterectomy    . Back surgery     No family history on file. Social History  Substance Use Topics  . Smoking status: Former Research scientist (life sciences)  . Smokeless tobacco: None  . Alcohol Use: Yes     Comment: patient reports doesn't normally drink alcohol, drank "bootleg" tonight.   OB History    No data available     Review of Systems  Constitutional: Negative for fever, chills and fatigue.  HENT: Negative for sore throat and trouble swallowing.   Respiratory: Negative for cough, shortness of breath and wheezing.   Cardiovascular: Negative  for chest pain and palpitations.  Gastrointestinal: Negative for nausea, vomiting, abdominal pain and blood in stool.  Genitourinary: Negative for dysuria, hematuria and flank pain.  Musculoskeletal: Positive for myalgias. Negative for back pain, arthralgias, neck pain and neck stiffness.  Skin: Negative for color change, rash and wound.  Neurological: Negative for dizziness, weakness and numbness.  Hematological: Does not bruise/bleed easily.    Allergies  Review of patient's allergies indicates no known allergies.  Home Medications   Prior to Admission medications   Medication Sig Start Date End Date Taking? Authorizing Provider  aspirin EC 81 MG tablet Take 81 mg by mouth daily.   Yes Historical Provider, MD  Multiple Vitamins-Minerals (ONE-A-DAY ENERGY PO) Take 1 tablet by mouth daily.     Yes Historical Provider, MD  omeprazole (PRILOSEC) 40 MG capsule Take 40 mg by mouth daily.  07/14/15  Yes Historical Provider, MD  oxyCODONE-acetaminophen (PERCOCET) 10-325 MG tablet Take 1 tablet by mouth every 8 (eight) hours. 08/27/15  Yes Historical Provider, MD  simvastatin (ZOCOR) 10 MG tablet Take 10 mg by mouth daily at 6 PM.  12/13/14  Yes Historical Provider, MD   Triage Vitals: BP 159/114 mmHg  Pulse 89  Temp(Src) 98.2 F (36.8 C) (Oral)  Resp 17  Ht 5\' 6"  (1.676 m)  Wt 138 lb (62.596 kg)  BMI 22.28 kg/m2  SpO2 100% Physical Exam  Constitutional: She is oriented to person, place, and  time. She appears well-developed and well-nourished.  HENT:  Head: Normocephalic and atraumatic.  Eyes: EOM are normal.  Neck: Normal range of motion.  Cardiovascular: Normal rate, regular rhythm and intact distal pulses.   Palpable DP pulses bilaterally.  Pulmonary/Chest: Effort normal.  Abdominal: Soft. She exhibits no distension. There is no tenderness.  Musculoskeletal: Normal range of motion. She exhibits tenderness.  Palpable, flesh colored, mobile mass to lateral right hip. No erythema.  Focal fluctuance of lateral right lower leg with tenderness to posterior calf. No erythema or edema.  Neurological: She is alert and oriented to person, place, and time.  Distal sensations intact bilaterally.  Skin: Skin is warm and dry.  Psychiatric: She has a normal mood and affect. Her behavior is normal.  Nursing note and vitals reviewed.   ED Course  Procedures (including critical care time) DIAGNOSTIC STUDIES: Oxygen Saturation is 100% on RA, normal by my interpretation.   COORDINATION OF CARE: 1:03 PM- Will X-Ray right hip and order doppler study of RLE. Pt verbalizes understanding and agrees to plan.  Medications - No data to display  Labs Review Labs Reviewed - No data to display  Imaging Review US Venous Img Lower Unilateral Right  09/05/2015  CLINICAL DATA:  Right lower extremity pain and swelling for 7 months. Cramping for 1 week. EXAM: RIGHT LOWER EXTREMITY VENOUS DOPPLER ULTRASOUND TECHNIQUE: Gray-scale sonography with graded compression, as well as color Doppler and duplex ultrasound were performed to evaluate the lower extremity deep venous systems from the level of the common femoral vein and including the common femoral, femoral, profunda femoral, popliteal and calf veins including the posterior tibial, peroneal and gastrocnemius veins when visible. The superficial great saphenous vein was also interrogated. Spectral Doppler was utilized to evaluate flow at rest and with distal augmentation maneuvers in the common femoral, femoral and popliteal veins. COMPARISON:  None. FINDINGS: Contralateral Common Femoral Vein: Respiratory phasicity is normal and symmetric with the symptomatic side. No evidence of thrombus. Normal compressibility. Common Femoral Vein: No evidence of thrombus. Normal compressibility, respiratory phasicity and response to augmentation. Saphenofemoral Junction: No evidence of thrombus. Normal compressibility and flow on color Doppler imaging. Profunda Femoral  Vein: No evidence of thrombus. Normal compressibility and flow on color Doppler imaging. Femoral Vein: No evidence of thrombus. Normal compressibility, respiratory phasicity and response to augmentation. Popliteal Vein: No evidence of thrombus. Normal compressibility, respiratory phasicity and response to augmentation. Calf Veins: No evidence of thrombus. Normal compressibility and flow on color Doppler imaging. Superficial Great Saphenous Vein: No evidence of thrombus. Normal compressibility and flow on color Doppler imaging. Venous Reflux:  None. Other Findings: Focus of intermediate increased echogenicity on the posterior and lateral aspect of the right hip measures 4.4 x 4.1 cm and may be a simple lipoma. IMPRESSION: No evidence of deep venous thrombosis. Electronically Signed   By: Inge Rise M.D.   On: 09/05/2015 14:11   Dg Hip Unilat With Pelvis 2-3 Views Right  09/05/2015  CLINICAL DATA:  Right hip pain for 7 months. EXAM: DG HIP (WITH OR WITHOUT PELVIS) 2-3V RIGHT COMPARISON:  None. FINDINGS: There is no evidence of hip fracture or dislocation. There is no evidence of arthropathy or other focal bone abnormality. IMPRESSION: Negative. Electronically Signed   By: Dorise Bullion III M.D   On: 09/05/2015 13:45   I have personally reviewed and evaluated these images and lab results as part of my medical decision-making.   EKG Interpretation None      MDM  Final diagnoses:  Lipoma  Hip pain, acute, right    Focal tenderness of right hip at site of likely lipoma and tenderness at lower leg possible varicosity.  [place] neg for DVT.  No chest pain or dyspnea.  She ambulates with a steady gait.  No concerning sx's for emergent neurological process.  Appears stable for d/c.  Given referral info for general surgery for possible excision of symptomatic lipoma.   I personally performed the services described in this documentation, which was scribed in my presence. The recorded information has been  reviewed and is accurate.     Kem Parkinson, PA-C 09/06/15 [place], MD 09/06/15 2007

## 2015-09-20 ENCOUNTER — Encounter (HOSPITAL_COMMUNITY)
Admission: RE | Admit: 2015-09-20 | Discharge: 2015-09-20 | Disposition: A | Discharge status: Home / Self Care | Payer: Medicare Other | Source: Ambulatory Visit | Attending: Surgery | Admitting: Surgery

## 2015-09-20 NOTE — Patient Instructions (Signed)
Karina Holland  09/20/2015     @PREFPERIOPPHARMACY @   Your procedure is scheduled on  09/23/2015   Report to [facility] at  46  A.M.  Call this number if you have problems the morning of surgery:  [phone]   Remember:  Do not eat food or drink liquids after midnight.  Take these medicines the morning of surgery with A SIP OF WATER  Hydrocodone or percocet, prilosec.   Do not wear jewelry, make-up or nail polish.  Do not wear lotions, powders, or perfumes.  You may wear deoderant.  Do not shave 48 hours prior to surgery.  Men may shave face and neck.  Do not bring valuables to the hospital.  [facility] is not responsible for any belongings or valuables.  Contacts, dentures or bridgework may not be worn into surgery.  Leave your suitcase in the car.  After surgery it may be brought to your room.  For patients admitted to the hospital, discharge time will be determined by your treatment team.  Patients discharged the day of surgery will not be allowed to drive home.   Name and phone number of your driver:   family Special instructions:  none  Please read over the following fact sheets that you were given. Coughing and Deep Breathing, Surgical Site Infection Prevention, Anesthesia Post-op Instructions and Care and Recovery After Surgery      Incision Care An incision is when a surgeon cuts into your body. After surgery, the incision needs to be cared for properly to prevent infection.  HOW TO CARE FOR YOUR INCISION  Take medicines only as directed by your health care provider.  There are many different ways to close and cover an incision, including stitches, skin glue, and adhesive strips. Follow your health care provider's instructions on:  Incision care.  Bandage (dressing) changes and removal.  Incision closure removal.  Do not take baths, swim, or use a hot tub until your health care provider approves. You may shower as directed by your health  care provider.  Resume your normal diet and activities as directed.  Use anti-itch medicine (such as an antihistamine) as directed by your health care provider. The incision may itch while it is healing. Do not pick or scratch at the incision.  Drink enough fluid to keep your urine clear or pale yellow. SEEK MEDICAL CARE IF:   You have drainage, redness, swelling, or pain at your incision site.  You have muscle aches, chills, or a general ill feeling.  You notice a bad smell coming from the incision or dressing.  Your incision edges separate after the sutures, staples, or skin adhesive strips have been removed.  You have persistent nausea or vomiting.  You have a fever.  You are dizzy. SEEK IMMEDIATE MEDICAL CARE IF:   You have a rash.  You faint.  You have difficulty breathing. MAKE SURE YOU:   Understand these instructions.  Will watch your condition.  Will get help right away if you are not doing well or get worse.   This information is not intended to replace advice given to you by your health care provider. Make sure you discuss any questions you have with your health care provider.   Document Released: 10/06/2004 Document Revised: 04/09/2014 Document Reviewed: 05/13/2013 Elsevier Interactive Patient Education 2016 [employer] Lipoma A lipoma is a noncancerous (benign) tumor that is made up of fat cells. This is a very common type  of soft-tissue growth. Lipomas are usually found under the skin (subcutaneous). They may occur in any tissue of the body that contains fat. Common areas for lipomas to appear include the back, shoulders, buttocks, and thighs. Lipomas grow slowly, and they are usually painless. Most lipomas do not cause problems and do not require treatment. CAUSES The cause of this condition is not known. RISK FACTORS This condition is more likely to develop in:  People who are 12-32 years old.  People who have a family history of  lipomas. SYMPTOMS A lipoma usually appears as a small, round bump under the skin. It may feel soft or rubbery, but the firmness can vary. Most lipomas are not painful. However, a lipoma may become painful if it is located in an area where it pushes on nerves. DIAGNOSIS A lipoma can usually be diagnosed with a physical exam. You may also have tests to confirm the diagnosis and to rule out other conditions. Tests may include:  Imaging tests, such as a CT scan or MRI.  Removal of a tissue sample to be looked at under a microscope (biopsy). TREATMENT Treatment is not needed for small lipomas that are not causing problems. If a lipoma continues to get bigger or it causes problems, removal is often the best option. Lipomas can also be removed to improve appearance. Removal of a lipoma is usually done with a surgery in which the fatty cells and the surrounding capsule are removed. Most often, a medicine that numbs the area (local anesthetic) is used for this procedure. HOME CARE INSTRUCTIONS  Keep all follow-up visits as directed by your health care provider. This is important. SEEK MEDICAL CARE IF:  Your lipoma becomes larger or hard.  Your lipoma becomes painful, red, or increasingly swollen. These could be signs of infection or a more serious condition.   This information is not intended to replace advice given to you by your health care provider. Make sure you discuss any questions you have with your health care provider.   Document Released: 03/09/2002 Document Revised: 08/03/2014 Document Reviewed: 03/15/2014 Elsevier Interactive Patient Education 2016 Elsevier Inc[employer] PATIENT INSTRUCTIONS POST-ANESTHESIA  IMMEDIATELY FOLLOWING SURGERY:  Do not drive or operate machinery for the first twenty four hours after surgery.  Do not make any important decisions for twenty four hours after surgery or while taking narcotic pain medications or sedatives.  If you develop intractable nausea and vomiting  or a severe headache please notify your doctor immediately.  FOLLOW-UP:  Please make an appointment with your surgeon as instructed. You do not need to follow up with anesthesia unless specifically instructed to do so.  WOUND CARE INSTRUCTIONS (if applicable):  Keep a dry clean dressing on the anesthesia/puncture wound site if there is drainage.  Once the wound has quit draining you may leave it open to air.  Generally you should leave the bandage intact for twenty four hours unless there is drainage.  If the epidural site drains for more than 36-48 hours please call the anesthesia department.  QUESTIONS?:  Please feel free to call your physician or the hospital operator if you have any questions, and they will be happy to assist you.

## 2015-09-21 ENCOUNTER — Encounter (HOSPITAL_COMMUNITY)
Admission: RE | Admit: 2015-09-21 | Discharge: 2015-09-21 | Disposition: A | Discharge status: Home / Self Care | Payer: Medicare Other | Source: Ambulatory Visit | Attending: Surgery | Admitting: Surgery

## 2015-09-21 ENCOUNTER — Encounter (HOSPITAL_COMMUNITY): Payer: Self-pay

## 2015-09-21 ENCOUNTER — Other Ambulatory Visit: Payer: Self-pay

## 2015-09-21 DIAGNOSIS — G8929 Other chronic pain: Secondary | ICD-10-CM | POA: Diagnosis not present

## 2015-09-21 DIAGNOSIS — M549 Dorsalgia, unspecified: Secondary | ICD-10-CM | POA: Diagnosis not present

## 2015-09-21 DIAGNOSIS — F329 Major depressive disorder, single episode, unspecified: Secondary | ICD-10-CM | POA: Diagnosis not present

## 2015-09-21 DIAGNOSIS — R222 Localized swelling, mass and lump, trunk: Secondary | ICD-10-CM | POA: Diagnosis present

## 2015-09-21 DIAGNOSIS — E7800 Pure hypercholesterolemia, unspecified: Secondary | ICD-10-CM | POA: Diagnosis not present

## 2015-09-21 DIAGNOSIS — Z87891 Personal history of nicotine dependence: Secondary | ICD-10-CM | POA: Diagnosis not present

## 2015-09-21 DIAGNOSIS — D1779 Benign lipomatous neoplasm of other sites: Secondary | ICD-10-CM | POA: Diagnosis not present

## 2015-09-21 DIAGNOSIS — K219 Gastro-esophageal reflux disease without esophagitis: Secondary | ICD-10-CM | POA: Diagnosis not present

## 2015-09-21 HISTORY — DX: Unspecified osteoarthritis, unspecified site: M19.90

## 2015-09-21 HISTORY — DX: Major depressive disorder, single episode, unspecified: F32.9

## 2015-09-21 HISTORY — DX: Depression, unspecified: F32.A

## 2015-09-21 LAB — BASIC METABOLIC PANEL
Anion gap: 5 (ref 5–15)
BUN: 11 mg/dL (ref 6–20)
CHLORIDE: 106 mmol/L (ref 101–111)
CO2: 25 mmol/L (ref 22–32)
Calcium: 9.4 mg/dL (ref 8.9–10.3)
Creatinine, Ser: 0.96 mg/dL (ref 0.44–1.00)
GFR calc Af Amer: >60 mL/min (ref >60)
GFR calc non Af Amer: 59 mL/min — ABNORMAL LOW (ref >60)
Glucose, Bld: 110 mg/dL — ABNORMAL HIGH (ref 65–99)
POTASSIUM: 3.9 mmol/L (ref 3.5–5.1)
SODIUM: 136 mmol/L (ref 135–145)

## 2015-09-21 LAB — CBC WITH DIFFERENTIAL/PLATELET
Basophils Absolute: 0 10*3/uL (ref 0.0–0.1)
Basophils Relative: 0 %
Eosinophils Absolute: 0.1 10*3/uL (ref 0.0–0.7)
Eosinophils Relative: 1 %
HCT: 40.2 % (ref 36.0–46.0)
HEMOGLOBIN: 13.2 g/dL (ref 12.0–15.0)
LYMPHS ABS: 2.9 10*3/uL (ref 0.7–4.0)
LYMPHS PCT: 31 %
MCH: 29.5 pg (ref 26.0–34.0)
MCHC: 32.8 g/dL (ref 30.0–36.0)
MCV: 89.7 fL (ref 78.0–100.0)
Monocytes Absolute: 0.5 10*3/uL (ref 0.1–1.0)
Monocytes Relative: 5 %
NEUTROS ABS: 6 10*3/uL (ref 1.7–7.7)
NEUTROS PCT: 63 %
Platelets: 326 10*3/uL (ref 150–400)
RBC: 4.48 MIL/uL (ref 3.87–5.11)
RDW: 13.2 % (ref 11.5–15.5)
WBC: 9.5 10*3/uL (ref 4.0–10.5)

## 2015-09-21 NOTE — Pre-Procedure Instructions (Signed)
Patient given information to sign up for my chart at home. 

## 2015-09-23 ENCOUNTER — Ambulatory Visit (HOSPITAL_COMMUNITY): Payer: Medicare Other | Admitting: Anesthesiology

## 2015-09-23 ENCOUNTER — Encounter (HOSPITAL_COMMUNITY)
Admission: RE | Disposition: A | Discharge status: Home / Self Care | Payer: Self-pay | Source: Ambulatory Visit | Attending: Surgery

## 2015-09-23 ENCOUNTER — Encounter (HOSPITAL_COMMUNITY): Payer: Self-pay | Admitting: *Deleted

## 2015-09-23 ENCOUNTER — Ambulatory Visit (HOSPITAL_COMMUNITY)
Admission: RE | Admit: 2015-09-23 | Discharge: 2015-09-23 | Disposition: A | Discharge status: Home / Self Care | Payer: Medicare Other | Source: Ambulatory Visit | Attending: Surgery | Admitting: Surgery

## 2015-09-23 DIAGNOSIS — E7800 Pure hypercholesterolemia, unspecified: Secondary | ICD-10-CM | POA: Diagnosis not present

## 2015-09-23 DIAGNOSIS — Z87891 Personal history of nicotine dependence: Secondary | ICD-10-CM | POA: Insufficient documentation

## 2015-09-23 DIAGNOSIS — G8929 Other chronic pain: Secondary | ICD-10-CM | POA: Insufficient documentation

## 2015-09-23 DIAGNOSIS — F329 Major depressive disorder, single episode, unspecified: Secondary | ICD-10-CM | POA: Insufficient documentation

## 2015-09-23 DIAGNOSIS — D1779 Benign lipomatous neoplasm of other sites: Secondary | ICD-10-CM | POA: Insufficient documentation

## 2015-09-23 DIAGNOSIS — K219 Gastro-esophageal reflux disease without esophagitis: Secondary | ICD-10-CM | POA: Insufficient documentation

## 2015-09-23 DIAGNOSIS — M549 Dorsalgia, unspecified: Secondary | ICD-10-CM | POA: Insufficient documentation

## 2015-09-23 HISTORY — PX: LIPOMA EXCISION: SHX5283

## 2015-09-23 SURGERY — EXCISION LIPOMA
Anesthesia: General | Laterality: Right

## 2015-09-23 MED ORDER — LIDOCAINE HCL (PF) 1 % IJ SOLN
INTRAMUSCULAR | Status: AC
  Filled 2015-09-23: qty 5

## 2015-09-23 MED ORDER — LACTATED RINGERS IV SOLN
INTRAVENOUS | Status: DC
  Administered 2015-09-23: 08:00:00 via INTRAVENOUS

## 2015-09-23 MED ORDER — FENTANYL CITRATE (PF) 100 MCG/2ML IJ SOLN
INTRAMUSCULAR | Status: AC
  Filled 2015-09-23: qty 2

## 2015-09-23 MED ORDER — PROPOFOL 10 MG/ML IV BOLUS
INTRAVENOUS | Status: AC
  Filled 2015-09-23: qty 40

## 2015-09-23 MED ORDER — ONDANSETRON HCL 4 MG/2ML IJ SOLN
INTRAMUSCULAR | Status: AC
  Filled 2015-09-23: qty 2

## 2015-09-23 MED ORDER — FENTANYL CITRATE (PF) 100 MCG/2ML IJ SOLN
25.0000 ug | INTRAMUSCULAR | Status: AC | PRN
  Administered 2015-09-23 (×2): 25 ug via INTRAVENOUS

## 2015-09-23 MED ORDER — PHENYLEPHRINE HCL 10 MG/ML IJ SOLN
INTRAMUSCULAR | Status: DC | PRN
  Administered 2015-09-23 (×2): 80 ug via INTRAVENOUS
  Administered 2015-09-23 (×2): 40 ug via INTRAVENOUS
  Administered 2015-09-23 (×3): 80 ug via INTRAVENOUS

## 2015-09-23 MED ORDER — LIDOCAINE HCL (PF) 1 % IJ SOLN
INTRAMUSCULAR | Status: AC
  Filled 2015-09-23: qty 30

## 2015-09-23 MED ORDER — LIDOCAINE HCL (CARDIAC) 10 MG/ML IV SOLN
INTRAVENOUS | Status: DC | PRN
  Administered 2015-09-23: 50 mg via INTRAVENOUS

## 2015-09-23 MED ORDER — BUPIVACAINE HCL (PF) 0.5 % IJ SOLN
INTRAMUSCULAR | Status: AC
  Filled 2015-09-23: qty 30

## 2015-09-23 MED ORDER — PROPOFOL 10 MG/ML IV BOLUS
INTRAVENOUS | Status: DC | PRN
  Administered 2015-09-23: 120 mg via INTRAVENOUS

## 2015-09-23 MED ORDER — MIDAZOLAM HCL 2 MG/2ML IJ SOLN
1.0000 mg | INTRAMUSCULAR | Status: DC | PRN
Start: 2015-09-23 — End: 2015-09-23
  Administered 2015-09-23: 2 mg via INTRAVENOUS

## 2015-09-23 MED ORDER — BACITRACIN ZINC 500 UNIT/GM EX OINT
TOPICAL_OINTMENT | CUTANEOUS | Status: AC
  Filled 2015-09-23: qty 0.9

## 2015-09-23 MED ORDER — CEFAZOLIN SODIUM-DEXTROSE 2-4 GM/100ML-% IV SOLN
2.0000 g | INTRAVENOUS | Status: AC
  Administered 2015-09-23: 2 g via INTRAVENOUS
  Filled 2015-09-23: qty 100

## 2015-09-23 MED ORDER — FENTANYL CITRATE (PF) 100 MCG/2ML IJ SOLN: 25.0000 ug | INTRAMUSCULAR | Status: DC | PRN

## 2015-09-23 MED ORDER — OXYCODONE-ACETAMINOPHEN 5-325 MG PO TABS: 1.0000 | ORAL_TABLET | ORAL | Status: AC | PRN

## 2015-09-23 MED ORDER — MIDAZOLAM HCL 2 MG/2ML IJ SOLN
INTRAMUSCULAR | Status: AC
  Filled 2015-09-23: qty 2

## 2015-09-23 MED ORDER — FENTANYL CITRATE (PF) 100 MCG/2ML IJ SOLN
INTRAMUSCULAR | Status: DC | PRN
  Administered 2015-09-23: 25 ug via INTRAVENOUS

## 2015-09-23 MED ORDER — PHENYLEPHRINE 40 MCG/ML (10ML) SYRINGE FOR IV PUSH (FOR BLOOD PRESSURE SUPPORT)
PREFILLED_SYRINGE | INTRAVENOUS | Status: AC
  Filled 2015-09-23: qty 10

## 2015-09-23 MED ORDER — ONDANSETRON HCL 4 MG/2ML IJ SOLN: 4.0000 mg | Freq: Once | INTRAMUSCULAR | Status: DC

## 2015-09-23 MED ORDER — ONDANSETRON HCL 4 MG/2ML IJ SOLN: 4.0000 mg | Freq: Once | INTRAMUSCULAR | Status: DC | PRN

## 2015-09-23 MED ORDER — BACITRACIN ZINC 500 UNIT/GM EX OINT
TOPICAL_OINTMENT | CUTANEOUS | Status: DC | PRN
  Administered 2015-09-23: 1 via TOPICAL

## 2015-09-23 MED ORDER — BUPIVACAINE HCL 0.5 % IJ SOLN
INTRAMUSCULAR | Status: DC | PRN
  Administered 2015-09-23: 7 mL

## 2015-09-23 MED ORDER — CHLORHEXIDINE GLUCONATE CLOTH 2 % EX PADS: 6.0000 | MEDICATED_PAD | Freq: Once | CUTANEOUS | Status: DC

## 2015-09-23 MED ORDER — PHENYLEPHRINE HCL 10 MG/ML IJ SOLN
INTRAMUSCULAR | Status: AC
  Filled 2015-09-23: qty 1

## 2015-09-23 MED ORDER — SODIUM CHLORIDE 0.9 % IR SOLN
Status: DC | PRN
  Administered 2015-09-23: 1000 mL

## 2015-09-23 SURGICAL SUPPLY — 40 items
APL SKNCLS STERI-STRIP NONHPOA (GAUZE/BANDAGES/DRESSINGS)
BAG HAMPER (MISCELLANEOUS) ×2 IMPLANT
BENZOIN TINCTURE PRP APPL 2/3 (GAUZE/BANDAGES/DRESSINGS) ×1 IMPLANT
CLOTH BEACON ORANGE TIMEOUT ST (SAFETY) ×2 IMPLANT
COVER LIGHT HANDLE STERIS (MISCELLANEOUS) ×4 IMPLANT
DRSG TEGADERM 4X4.75 (GAUZE/BANDAGES/DRESSINGS) ×1 IMPLANT
DURAPREP 26ML APPLICATOR (WOUND CARE) ×2 IMPLANT
ELECT NDL TIP 2.8 STRL (NEEDLE) IMPLANT
ELECT NEEDLE TIP 2.8 STRL (NEEDLE) IMPLANT
ELECT REM PT RETURN 9FT ADLT (ELECTROSURGICAL) ×2
ELECTRODE REM PT RTRN 9FT ADLT (ELECTROSURGICAL) ×1 IMPLANT
FORMALIN 10 PREFIL 120ML (MISCELLANEOUS) ×2 IMPLANT
GLOVE BIOGEL PI IND STRL 7.0 (GLOVE) ×1 IMPLANT
GLOVE BIOGEL PI IND STRL 7.5 (GLOVE) ×1 IMPLANT
GLOVE BIOGEL PI INDICATOR 7.0 (GLOVE) ×1
GLOVE BIOGEL PI INDICATOR 7.5 (GLOVE) ×1
GLOVE ECLIPSE 7.0 STRL STRAW (GLOVE) ×2 IMPLANT
GOWN STRL REUS W/TWL LRG LVL3 (GOWN DISPOSABLE) ×4 IMPLANT
KIT ROOM TURNOVER APOR (KITS) ×2 IMPLANT
MANIFOLD NEPTUNE II (INSTRUMENTS) ×2 IMPLANT
NDL HYPO 18GX1.5 BLUNT FILL (NEEDLE) IMPLANT
NDL HYPO 25X1 1.5 SAFETY (NEEDLE) ×1 IMPLANT
NEEDLE HYPO 18GX1.5 BLUNT FILL (NEEDLE) IMPLANT
NEEDLE HYPO 25X1 1.5 SAFETY (NEEDLE) ×2 IMPLANT
NS IRRIG 1000ML POUR BTL (IV SOLUTION) ×2 IMPLANT
PACK MINOR (CUSTOM PROCEDURE TRAY) ×2 IMPLANT
PAD ARMBOARD 7.5X6 YLW CONV (MISCELLANEOUS) ×2 IMPLANT
SET BASIN LINEN APH (SET/KITS/TRAYS/PACK) ×2 IMPLANT
SOL PREP PROV IODINE SCRUB 4OZ (MISCELLANEOUS) IMPLANT
SPONGE GAUZE 2X2 8PLY STRL LF (GAUZE/BANDAGES/DRESSINGS) ×2 IMPLANT
SPONGE LAP 18X18 X RAY DECT (DISPOSABLE) ×1 IMPLANT
STRIP CLOSURE SKIN 1/2X4 (GAUZE/BANDAGES/DRESSINGS) ×1 IMPLANT
SUT ETHILON 3 0 FSL (SUTURE) ×1 IMPLANT
SUT PROLENE 3 0 PS 1 (SUTURE) IMPLANT
SUT VIC AB 3-0 SH 27 (SUTURE) ×2
SUT VIC AB 3-0 SH 27X BRD (SUTURE) IMPLANT
SUT VIC AB 4-0 PS2 27 (SUTURE) ×2 IMPLANT
SYR BULB IRRIGATION 50ML (SYRINGE) ×2 IMPLANT
SYR CONTROL 10ML LL (SYRINGE) ×1 IMPLANT
SYRINGE 10CC LL (SYRINGE) ×2 IMPLANT

## 2015-09-23 NOTE — Anesthesia Procedure Notes (Signed)
Procedure Name: LMA Insertion Date/Time: 09/23/2015 7:54 AM Performed by: Vista Deck Pre-anesthesia Checklist: Patient identified, Patient being monitored, Emergency Drugs available, Timeout performed and Suction available Patient Re-evaluated:Patient Re-evaluated prior to inductionOxygen Delivery Method: Circle System Utilized Preoxygenation: Pre-oxygenation with 100% oxygen Intubation Type: IV induction Ventilation: Mask ventilation without difficulty LMA: LMA inserted LMA Size: 4.0 Number of attempts: 1 Placement Confirmation: positive ETCO2 and breath sounds checked- equal and bilateral Tube secured with: Tape Dental Injury: Teeth and Oropharynx as per pre-operative assessment

## 2015-09-23 NOTE — Anesthesia Postprocedure Evaluation (Signed)
Anesthesia Post Note  Patient: Karina Holland  Procedure(s) Performed: Procedure(s) (LRB): EXCISON RIGHT BUTTOCK SOFT TISSUE MASS (Right)  Patient location during evaluation: PACU Anesthesia Type: General Level of consciousness: awake and alert Pain management: pain level controlled Vital Signs Assessment: post-procedure vital signs reviewed and stable Respiratory status: spontaneous breathing Cardiovascular status: stable Anesthetic complications: no    Last Vitals:  Filed Vitals:   09/23/15 0735 09/23/15 0853  BP: 149/105   Temp:  36.4 C  Resp: 15     Last Pain:  Filed Vitals:   09/23/15 0902  PainSc: 8                  Curlee Bogan

## 2015-09-23 NOTE — Anesthesia Preprocedure Evaluation (Signed)
Anesthesia Evaluation  Patient identified by MRN, date of birth, ID band Patient awake    Reviewed: Allergy & Precautions, NPO status , Patient's Chart, lab work & pertinent test results  Airway Mallampati: I  TM Distance: >3 FB Neck ROM: Full    Dental  (+) Poor Dentition, Missing   Pulmonary former smoker,    breath sounds clear to auscultation       Cardiovascular negative cardio ROS   Rhythm:Regular Rate:Normal     Neuro/Psych PSYCHIATRIC DISORDERS Depression    GI/Hepatic GERD  Medicated and Controlled,  Endo/Other    Renal/GU      Musculoskeletal   Abdominal   Peds  Hematology   Anesthesia Other Findings   Reproductive/Obstetrics                             Anesthesia Physical Anesthesia Plan  ASA: II  Anesthesia Plan: General   Post-op Pain Management:    Induction: Intravenous  Airway Management Planned: LMA  Additional Equipment:   Intra-op Plan:   Post-operative Plan: Extubation in OR  Informed Consent: I have reviewed the patients History and Physical, chart, labs and discussed the procedure including the risks, benefits and alternatives for the proposed anesthesia with the patient or authorized representative who has indicated his/her understanding and acceptance.     Plan Discussed with:   Anesthesia Plan Comments:         Anesthesia Quick Evaluation

## 2015-09-23 NOTE — H&P (Signed)
Subjective:This 70 year old female presents forsymptoms of chronic Right buttock pain x7 months, worse with ambulation and sitting or laying on enlarged Right buttock mass. Patient denies RLE swelling or change in motor/sensation, fever/chills.  Review of Symptoms: Constitutional:UnremarkablenegativeNo fevers, chills, or unexplained weight lossROSGener.Marland KitchenMarland KitchenHead:UnremarkablenegativeAtraumatic; no masses; no abnormalitiesEyes:UnremarkablenegativeNo visual changes or eye painROSEyes.Marland KitchenMarland KitchenNose/Mouth/Throat:UnremarkablenegativeNo nasal congestion, rhinorrhea, oral lesions, postnasal drip or sore throat ROSENMT.Marland KitchenMarland KitchenCardiovascular:Unremarkablenegative No chest pain or palpitations. no lower extremity edema. no syncope no claudication.ROSCVS..Marland KitchenRespiratory:UnremarkablenegativeNo cough, shortness of breath or wheezing ROSResp.Marland KitchenMarland KitchenGastrointestinal:UnremarkablenegativeNo diarrhea, constipation, blood in stools, abdominal pain, vomiting or heartburnROSGI.Marland KitchenMarland KitchenGenitourinary:UnremarkablenegativeNo urinary frequency, hematuria, incontinence, or dysuriaROSGU.Marland KitchenMarland KitchenMusculoskeletal:UnremarkablenegativeNo arthalgias, myalgias or joint swellingROSMusc.Marland KitchenMarland KitchenSkin:UnremarkablenegativeNo rash or bothersome skin lesions except Right buttock mass as per HPI Breast:UnremarkablenegativeROSNEGBREASTHematolgic/Lymphatic:UnremarkablenegativeNo easy bruising, easy bleeding or swollen glandsROSHemeLy.Marland KitchenMarland KitchenAllergic/Immunologic:UnremarkablenegativeNo itching, sneezing , watery eyes, clear rhinorrhea or recurrent infectionsROSAllImm  Past Medical History:ObtainedReviewedPast Medical History Surgical History: Hysterectomy, back and neck surgeriesMedical Problems: chronic back pain, hypercholesterolemia, GERD  Social History:ObtainedReviewedSocial History Preferred Language: EnglishRace:  WhiteEthnicity: Not Hispanic / LatinoAge: 70 yearMarital Status:  W Smoking Status: Former smoker reviewed on 09/15/2015  Functional Status  reviewed on 09/15/2015 ------------------------------------------------ Bathing: Normal Cooking: Normal Dressing: Normal Driving: Normal Eating: Normal Managing Meds: Normal Oral Care: Normal Shopping: Normal Toileting: Normal Transferring: Normal Walking: Normal  Cognitive Status reviewed on 09/15/2015 ------------------------------------------------ Attention: NormalDecision Making: NormalLanguage: NormalMemory: NormalMotor: NormalPerception: NormalProblem Solving: NormalVisual and Spatial: Normal  Family History:ObtainedReviewedFamily Health History     None recorded.  Objective Information:General:UnremarkableWell appearing, well nourished in no distress.illGeneral Complex AbnormalitiesSkin:Unremarkableno rash or prominent lesionsSkin Complex AbnormalitiesHead:UnremarkableAtraumatic; no masses; no abnormalitiesHead Complex AbnormalitiesEyes:Unremarkableconjunctiva clear, EOM intact, PERRLEyes Complex AbnormaMouth:UnremarkableMucous membranes moist, no mucosal lesions.Mouth Complex AbnormalitiesThroat:Unremarkableno erythema, exudates or lesions.Throat Complex AbnormalitiesNeck:UnremarkableSupple without lymphadenopathy. Thyroid Exam NLNeck Complex AbnormaHeart:UnremarkableRRR, no murmurHeart NormalHeart Complex AbnormLungs:UnremarkableCTA bilaterally, no wheezes, rhonchi, rales.  Breathing unlabored.Lungs Complex AbnormBreasts:UnremarkableBreastsTannerGirlsBreastBreastsCAAbdomen:UnremarkableSoft, NT/ND, no HSM, no masses.Abdomen NormalAbdomen Complex AbnoBack:Unremarkablespine normal without deformity or tenderness.  Normal ROMBack Complex AbnormaGU:UnremarkableGUMaleTannerBoysGUGUMale Complex AbnorGUFemTannerGirlsGUGUFemCARectal:UnremarkableRectalMaleRectalMale Complex ARectalFemRectalFemCAExtremities:Unremarkable 3 cm spongy fixed tender Right gluteal mass consistent with and reproduces patients symptoms upon palpation, no clubbing,  cyanosis, or edema. no TTPExtremit Complex AbnMusculoskeletal:UnremarkableMusculoskeMusculoCALymphatics:UnremarkableLymphaticsLymph Complex Abnormalities . Right Lower extremity venous duplex (09/05/2015) No DVT, but focus of intermediate increased echogenicity on the posterior and lateral aspect of the right hip measuring 4.4 x 4.1 cm, may be a simple lipoma.  Plan: - Will plan for elective outpatient excision of symptomatic Right buttock soft tissue mass, likely lipoma

## 2015-09-23 NOTE — Op Note (Signed)
SURGICAL OPERATIVE REPORT  ATTENDING Surgeon(s): Vickie Epley, MD  ASSISTANT(S): None   ANESTHESIA: general   PRE-OPERATIVE DIAGNOSIS: Painful Right buttock soft tissue mass   POST-OPERATIVE DIAGNOSIS: Painful Right buttock soft tissue mass   PROCEDURE(S):  1.) Excision of painful Right buttock soft tissue mass   INTRAOPERATIVE FINDINGS: Well-circumscribed Right buttock soft tissue mass adherent to fascia   INTRAVENOUS FLUIDS: 800 mL crystalloid   ESTIMATED BLOOD LOSS: Minimal (< 20 mL)  URINE OUTPUT: No foley   SPECIMENS: Right buttock soft tissue mass  IMPLANTS: None  DRAINS: none  COMPLICATIONS: None apparent  CONDITION AT END OF PROCEDURE: Hemodynamically stable and extubated  DISPOSITION OF PATIENT: PACU  INDICATIONS FOR PROCEDURE:  70 year old female presented forworsening chronic Right buttock pain x7 months, worse with ambulation and sitting or when laying on an enlarged Right buttock mass. Patient denies RLE swelling or change in motor/sensation, fever/chills. All risks, benefits, and alternatives to above procedure were discussed with the patient, all of patient's questions were answered to her expressed satisfaction, and informed consent was obtained and documented.  DETAILS OF PROCEDURE: Patient was brought to the operating suite and appropriately identified. General anesthesia was administered along with appropriate pre-operative antibiotics, and endotracheal intubation was performed by anesthetist. With Right side up, operative site was prepped and draped in the usual sterile fashion, and following a brief time out, initial 2-3 cm incision was made along natural skin crease using a #15 blade scalpel and extended deep through dermis and superficial soft tissues using electro cautery and blunt dissection, taking care not to disrupt the underlying palpable soft tissue mass. Well-circumscribed fatty soft tissue mass was able to be freed from surrounding  tissues except for underlying fascia, a small area of which was excised, and the mass was able to be removed and handed off the field for pathology evaluation. Wound was irrigated with sterile water, fascia was re-approximated without significant tension, and wound was re-approximated in layers using 3-0 Vicryl suture for deep tissues and dermis with 3-0 Nylon vertical mattress sutures for skin. Bacitracin and sterile gauze dressing were applied. Patient was then safely able to be extubated, awaken, and transferred to PACU for post-operative monitoring and care.  I was present for all aspects of the above procedure, and there were no complications apparent.

## 2015-09-23 NOTE — Transfer of Care (Signed)
Immediate Anesthesia Transfer of Care Note  Patient: Karina Holland  Procedure(s) Performed: Procedure(s): EXCISON RIGHT BUTTOCK SOFT TISSUE MASS (Right)  Patient Location: PACU  Anesthesia Type:General  Level of Consciousness: sedated and patient cooperative  Airway & Oxygen Therapy: Patient Spontanous Breathing and non-rebreather face mask  Post-op Assessment: Report given to RN, Post -op Vital signs reviewed and stable and Patient moving all extremities  Post vital signs: Reviewed and stable   Last Pain:  Filed Vitals:   09/23/15 0739  PainSc: 8       Patients Stated Pain Goal: 6 ([phone] [id number])  Complications: No apparent anesthesia complications

## 2015-09-23 NOTE — Interval H&P Note (Signed)
History and Physical Interval Note:  09/23/2015 7:41 AM  Karina Holland  has presented today for surgery, with the diagnosis of lipoma right buttock  The various methods of treatment have been discussed with the patient and family. After consideration of risks, benefits and other options for treatment, the patient has consented to  Procedure(s): EXCISON RIGHT BUTTOCK SOFT TISSUE MASS (Right) as a surgical intervention .  The patient's history has been reviewed, patient examined, no change in status, stable for surgery.  I have reviewed the patient's chart and labs.  Questions were answered to the patient's satisfaction.     Vickie Epley

## 2015-09-27 ENCOUNTER — Other Ambulatory Visit (HOSPITAL_COMMUNITY): Payer: Self-pay | Admitting: Internal Medicine

## 2015-09-27 ENCOUNTER — Encounter (HOSPITAL_COMMUNITY): Payer: Self-pay | Admitting: Surgery

## 2015-09-27 DIAGNOSIS — Z780 Asymptomatic menopausal state: Secondary | ICD-10-CM

## 2015-10-07 ENCOUNTER — Ambulatory Visit (HOSPITAL_COMMUNITY): Payer: Medicare Other

## 2015-10-07 ENCOUNTER — Other Ambulatory Visit (HOSPITAL_COMMUNITY): Payer: Medicare Other

## 2015-10-11 ENCOUNTER — Encounter (HOSPITAL_COMMUNITY): Payer: Self-pay | Admitting: Emergency Medicine

## 2015-10-11 ENCOUNTER — Emergency Department (HOSPITAL_COMMUNITY)
Admission: EM | Admit: 2015-10-11 | Discharge: 2015-10-11 | Disposition: A | Discharge status: Home / Self Care | Payer: Medicare Other | Attending: Emergency Medicine | Admitting: Emergency Medicine

## 2015-10-11 DIAGNOSIS — Z87891 Personal history of nicotine dependence: Secondary | ICD-10-CM | POA: Diagnosis not present

## 2015-10-11 DIAGNOSIS — M549 Dorsalgia, unspecified: Secondary | ICD-10-CM | POA: Diagnosis not present

## 2015-10-11 DIAGNOSIS — Z79899 Other long term (current) drug therapy: Secondary | ICD-10-CM | POA: Insufficient documentation

## 2015-10-11 DIAGNOSIS — Z7982 Long term (current) use of aspirin: Secondary | ICD-10-CM | POA: Diagnosis not present

## 2015-10-11 DIAGNOSIS — Z4802 Encounter for removal of sutures: Secondary | ICD-10-CM | POA: Insufficient documentation

## 2015-10-11 DIAGNOSIS — F329 Major depressive disorder, single episode, unspecified: Secondary | ICD-10-CM | POA: Diagnosis not present

## 2015-10-11 DIAGNOSIS — M1990 Unspecified osteoarthritis, unspecified site: Secondary | ICD-10-CM | POA: Insufficient documentation

## 2015-10-11 NOTE — Discharge Instructions (Signed)

## 2015-10-11 NOTE — ED Notes (Signed)
Pt here for suture removal. Pt had sutures placed on RT buttock 2 weeks ago. Pt states she had a tumor removed. Pt has no complaints.

## 2015-10-11 NOTE — ED Notes (Signed)
3 sutures removed from right buttocks. Edges well approximated. No redness, drainage, or swelling noted.

## 2015-10-11 NOTE — ED Provider Notes (Signed)
CSN: [id number]     Arrival date & time 10/11/15  1824 History   First MD Initiated Contact with Patient 10/11/15 1836     Chief Complaint  Patient presents with  . Suture / Staple Removal     (Consider location/radiation/quality/duration/timing/severity/associated sxs/prior Treatment) Patient is a 70 y.o. female presenting with suture removal. The history is provided by the patient.  Suture / Staple Removal The current episode started 1 to 4 weeks ago. The problem occurs constantly. The problem has been gradually improving. Associated symptoms include arthralgias. Pertinent negatives include no chills, fatigue, fever or nausea. Nothing aggravates the symptoms. She has tried nothing for the symptoms.    Past Medical History  Diagnosis Date  . Hypercholesteremia   . GERD (gastroesophageal reflux disease)   . Back pain   . Depression   . Arthritis     osteoarthritis.   Past Surgical History  Procedure Laterality Date  . Neck surgery    . Abdominal hysterectomy    . Back surgery    . Cataract extraction Bilateral   . Lipoma excision Right 09/23/2015    Procedure: EXCISON RIGHT BUTTOCK SOFT TISSUE MASS;  Surgeon: Vickie Epley, MD;  Location: [facility];  Service: General;  Laterality: Right;   No family history on file. Social History  Substance Use Topics  . Smoking status: Former Smoker -- 0.25 packs/day for 4 years    Types: Cigarettes    Quit date: 03/23/2015  . Smokeless tobacco: None  . Alcohol Use: Yes     Comment: patient reports doesn't normally drink alcohol, drank "bootleg" tonight.   OB History    No data available     Review of Systems  Constitutional: Negative for fever, chills and fatigue.  Gastrointestinal: Negative for nausea.  Musculoskeletal: Positive for back pain and arthralgias.  Skin: Positive for wound.  All other systems reviewed and are negative.     Allergies  Review of patient's allergies indicates no known allergies.  Home  Medications   Prior to Admission medications   Medication Sig Start Date End Date Taking? Authorizing Provider  aspirin EC 81 MG tablet Take 81 mg by mouth daily.    Historical Provider, MD  HYDROcodone-acetaminophen (NORCO/VICODIN) 5-325 MG tablet Take one tab po q 4-6 hrs prn pain Patient not taking: Reported on 09/20/2015 09/05/15   Tammy Triplett, PA-C  Multiple Vitamins-Minerals (ONE-A-DAY ENERGY PO) Take 1 tablet by mouth daily.      Historical Provider, MD  omeprazole (PRILOSEC) 40 MG capsule Take 40 mg by mouth daily.  07/14/15   Historical Provider, MD  oxyCODONE-acetaminophen (ROXICET) 5-325 MG tablet Take 1 tablet by mouth every 4 (four) hours as needed for severe pain. 09/23/15   Vickie Epley, MD  simvastatin (ZOCOR) 10 MG tablet Take 10 mg by mouth daily at 6 PM.  12/13/14   Historical Provider, MD   BP 136/97 mmHg  Pulse 97  Temp(Src) 98.3 F (36.8 C) (Oral)  Resp 18  SpO2 100% Physical Exam  Constitutional: She is oriented to person, place, and time. She appears well-developed and well-nourished.  Non-toxic appearance.  HENT:  Head: Normocephalic.  Right Ear: Tympanic membrane and external ear normal.  Left Ear: Tympanic membrane and external ear normal.  Eyes: EOM and lids are normal. Pupils are equal, round, and reactive to light.  Neck: Normal range of motion. Neck supple. Carotid bruit is not present.  Cardiovascular: Normal rate, regular rhythm, normal heart sounds, intact distal pulses and normal  pulses.   Pulmonary/Chest: Breath sounds normal. No respiratory distress.  Abdominal: Soft. Bowel sounds are normal. There is no tenderness. There is no guarding.  Musculoskeletal: Normal range of motion.  Lymphadenopathy:       Head (right side): No submandibular adenopathy present.       Head (left side): No submandibular adenopathy present.    She has no cervical adenopathy.  Neurological: She is alert and oriented to person, place, and time. She has normal strength.  No cranial nerve deficit or sensory deficit.  Skin: Skin is warm and dry.     Psychiatric: She has a normal mood and affect. Her speech is normal.  Nursing note and vitals reviewed.   ED Course  Procedures (including critical care time) Labs Review Labs Reviewed - No data to display  Imaging Review No results found. I have personally reviewed and evaluated these images and lab results as part of my medical decision-making.   EKG Interpretation None      MDM  Vital signs within normal limits. Sutured wound is healed nicely. Sutures removed by nursing staff without problem. Patient will see the surgeon, or return to the department if any signs of any infection. Patient is in agreement with this discharge plan.    Final diagnoses:  Visit for suture removal    **I have reviewed nursing notes, vital signs, and all appropriate lab and imaging results for this patient.Lily Kocher, PA-C 10/11/15 [place], MD 10/12/15 [phone]

## 2015-10-14 ENCOUNTER — Ambulatory Visit (HOSPITAL_COMMUNITY): Payer: Medicare Other

## 2015-10-14 ENCOUNTER — Other Ambulatory Visit (HOSPITAL_COMMUNITY): Payer: Medicare Other

## 2015-10-20 ENCOUNTER — Ambulatory Visit (HOSPITAL_COMMUNITY): Payer: Medicare Other

## 2015-10-21 ENCOUNTER — Ambulatory Visit (HOSPITAL_COMMUNITY)
Admission: RE | Admit: 2015-10-21 | Discharge: 2015-10-21 | Disposition: A | Discharge status: Home / Self Care | Payer: Medicare Other | Source: Ambulatory Visit | Attending: Internal Medicine | Admitting: Internal Medicine

## 2015-10-21 ENCOUNTER — Other Ambulatory Visit (HOSPITAL_COMMUNITY): Payer: Medicare Other

## 2015-10-21 DIAGNOSIS — Z1231 Encounter for screening mammogram for malignant neoplasm of breast: Secondary | ICD-10-CM | POA: Insufficient documentation

## 2015-10-21 DIAGNOSIS — Z780 Asymptomatic menopausal state: Secondary | ICD-10-CM | POA: Diagnosis present

## 2015-10-21 DIAGNOSIS — M810 Age-related osteoporosis without current pathological fracture: Secondary | ICD-10-CM | POA: Diagnosis not present

## 2015-10-31 ENCOUNTER — Other Ambulatory Visit (HOSPITAL_COMMUNITY): Payer: Self-pay | Admitting: Internal Medicine

## 2015-10-31 DIAGNOSIS — R197 Diarrhea, unspecified: Secondary | ICD-10-CM

## 2015-10-31 DIAGNOSIS — R5081 Fever presenting with conditions classified elsewhere: Secondary | ICD-10-CM

## 2015-10-31 DIAGNOSIS — M549 Dorsalgia, unspecified: Secondary | ICD-10-CM

## 2015-11-11 ENCOUNTER — Ambulatory Visit (HOSPITAL_COMMUNITY)
Admission: RE | Admit: 2015-11-11 | Discharge: 2015-11-11 | Disposition: A | Discharge status: Home / Self Care | Payer: Medicare Other | Source: Ambulatory Visit | Attending: Internal Medicine | Admitting: Internal Medicine

## 2015-11-11 ENCOUNTER — Other Ambulatory Visit (HOSPITAL_COMMUNITY): Payer: Self-pay | Admitting: Internal Medicine

## 2015-11-11 ENCOUNTER — Ambulatory Visit (HOSPITAL_COMMUNITY): Payer: Medicare Other

## 2015-11-11 DIAGNOSIS — M549 Dorsalgia, unspecified: Secondary | ICD-10-CM

## 2015-11-11 DIAGNOSIS — R197 Diarrhea, unspecified: Secondary | ICD-10-CM | POA: Diagnosis present

## 2015-11-11 DIAGNOSIS — R935 Abnormal findings on diagnostic imaging of other abdominal regions, including retroperitoneum: Secondary | ICD-10-CM | POA: Insufficient documentation

## 2015-11-11 DIAGNOSIS — R5081 Fever presenting with conditions classified elsewhere: Secondary | ICD-10-CM

## 2015-11-11 LAB — POCT I-STAT CREATININE: Creatinine, Ser: 1 mg/dL (ref 0.44–1.00)

## 2015-11-11 MED ORDER — IOPAMIDOL (ISOVUE-300) INJECTION 61%
100.0000 mL | Freq: Once | INTRAVENOUS | Status: AC | PRN
  Administered 2015-11-11: 100 mL via INTRAVENOUS

## 2016-03-31 ENCOUNTER — Emergency Department (HOSPITAL_COMMUNITY): Payer: Medicare Other

## 2016-03-31 ENCOUNTER — Emergency Department (HOSPITAL_COMMUNITY)
Admission: EM | Admit: 2016-03-31 | Discharge: 2016-03-31 | Disposition: A | Discharge status: Home / Self Care | Payer: Medicare Other | Attending: Emergency Medicine | Admitting: Emergency Medicine

## 2016-03-31 ENCOUNTER — Encounter (HOSPITAL_COMMUNITY): Payer: Self-pay | Admitting: *Deleted

## 2016-03-31 DIAGNOSIS — Z79899 Other long term (current) drug therapy: Secondary | ICD-10-CM | POA: Diagnosis not present

## 2016-03-31 DIAGNOSIS — Z7982 Long term (current) use of aspirin: Secondary | ICD-10-CM | POA: Diagnosis not present

## 2016-03-31 DIAGNOSIS — J40 Bronchitis, not specified as acute or chronic: Secondary | ICD-10-CM | POA: Diagnosis not present

## 2016-03-31 DIAGNOSIS — Z87891 Personal history of nicotine dependence: Secondary | ICD-10-CM | POA: Diagnosis not present

## 2016-03-31 DIAGNOSIS — R05 Cough: Secondary | ICD-10-CM | POA: Diagnosis present

## 2016-03-31 MED ORDER — PREDNISONE 20 MG PO TABS: 40.0000 mg | ORAL_TABLET | Freq: Every day | ORAL | 0 refills | Status: DC

## 2016-03-31 MED ORDER — ALBUTEROL SULFATE (2.5 MG/3ML) 0.083% IN NEBU
2.5000 mg | INHALATION_SOLUTION | Freq: Once | RESPIRATORY_TRACT | Status: AC
  Administered 2016-03-31: 2.5 mg via RESPIRATORY_TRACT
  Filled 2016-03-31: qty 3

## 2016-03-31 MED ORDER — IPRATROPIUM-ALBUTEROL 0.5-2.5 (3) MG/3ML IN SOLN
3.0000 mL | Freq: Once | RESPIRATORY_TRACT | Status: AC
  Administered 2016-03-31: 3 mL via RESPIRATORY_TRACT
  Filled 2016-03-31: qty 3

## 2016-03-31 MED ORDER — GUAIFENESIN-CODEINE 100-10 MG/5ML PO SYRP: 10.0000 mL | ORAL_SOLUTION | Freq: Three times a day (TID) | ORAL | 0 refills | Status: AC | PRN

## 2016-03-31 MED ORDER — ALBUTEROL SULFATE HFA 108 (90 BASE) MCG/ACT IN AERS: 1.0000 | INHALATION_SPRAY | RESPIRATORY_TRACT | 0 refills | Status: AC | PRN

## 2016-03-31 MED ORDER — GUAIFENESIN-CODEINE 100-10 MG/5ML PO SOLN
10.0000 mL | Freq: Once | ORAL | Status: AC
  Administered 2016-03-31: 10 mL via ORAL
  Filled 2016-03-31: qty 10

## 2016-03-31 NOTE — ED Notes (Signed)
Patient transported to X-ray 

## 2016-03-31 NOTE — ED Provider Notes (Signed)
[facility] DEPT Provider Note   CSN: [id number] Arrival date & time: 03/31/16  [id number]     History   Chief Complaint Chief Complaint  Patient presents with  . URI    HPI Karina Holland is a 70 y.o. female.  HPI   Karina Holland is a 70 y.o. female who presents to the Emergency Department complaining of cough and nasal congestion for nearly one week.  Cough has been persistent and productive at times.  Cough has interfered with sleep.  She has tried multiple OTC cough and cold medications without relief.  She states that other family members have similar symptoms.  Cough is also associated with sore throat, chills and generalized body aches.  She denies shortness of breath, fever, post-tussive emesis, chest pain or hx of asthma.    Past Medical History:  Diagnosis Date  . Arthritis    osteoarthritis.  . Back pain   . Depression   . GERD (gastroesophageal reflux disease)   . Hypercholesteremia     Patient Active Problem List   Diagnosis Date Noted  . GERD 11/30/2009  . 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 WEIGHT LOSS, ABNORMAL 11/30/2009  . CHANGE IN BOWELS 11/30/2009  . ABDOMINAL PAIN, EPIGASTRIC 11/30/2009  . ADENOCARCINOMA, COLON, HX OF 11/30/2009    Past Surgical History:  Procedure Laterality Date  . ABDOMINAL HYSTERECTOMY    . BACK SURGERY    . CATARACT EXTRACTION Bilateral   . LIPOMA EXCISION Right 09/23/2015   Procedure: EXCISON RIGHT BUTTOCK SOFT TISSUE MASS;  Surgeon: Vickie Epley, MD;  Location: [facility];  Service: General;  Laterality: Right;  . NECK SURGERY      OB History    No data available       Home Medications    Prior to Admission medications   Medication Sig Start Date End Date Taking? Authorizing Provider  aspirin EC 81 MG tablet Take 81 mg by mouth daily.   Yes Historical Provider, MD  Multiple Vitamins-Minerals (ONE-A-DAY ENERGY PO) Take 1 tablet by mouth daily.     Yes Historical Provider, MD  omeprazole (PRILOSEC) 40 MG capsule Take 40  mg by mouth daily.  07/14/15  Yes Historical Provider, MD  oxyCODONE-acetaminophen (ROXICET) 5-325 MG tablet Take 1 tablet by mouth every 4 (four) hours as needed for severe pain. 09/23/15  Yes Vickie Epley, MD  sertraline (ZOLOFT) 50 MG tablet Take 1 tablet by mouth daily. 02/03/16  Yes Historical Provider, MD  simvastatin (ZOCOR) 10 MG tablet Take 10 mg by mouth daily at 6 PM.  12/13/14  Yes Historical Provider, MD  venlafaxine (EFFEXOR) 37.5 MG tablet Take 37.5 mg by mouth daily. 02/03/16  Yes Historical Provider, MD  HYDROcodone-acetaminophen (NORCO/VICODIN) 5-325 MG tablet Take one tab po q 4-6 hrs prn pain Patient not taking: Reported on 09/20/2015 09/05/15   Kem Parkinson, PA-C    Family History No family history on file.  Social History Social History  Substance Use Topics  . Smoking status: Former Smoker    Packs/day: 0.25    Years: 4.00    Types: Cigarettes    Quit date: 03/23/2015  . Smokeless tobacco: Never Used  . Alcohol use Yes     Comment: patient reports doesn't normally drink alcohol, drank "bootleg" tonight.     Allergies   Patient has no known allergies.   Review of Systems Review of Systems  Constitutional: Positive for chills. Negative for activity change, appetite change and fever.  HENT: Positive for congestion,  rhinorrhea and sore throat. Negative for facial swelling and trouble swallowing.   Eyes: Negative for visual disturbance.  Respiratory: Positive for cough and wheezing. Negative for shortness of breath and stridor.   Cardiovascular: Negative for chest pain.  Gastrointestinal: Negative for nausea and vomiting.  Musculoskeletal: Positive for myalgias (generalized body aches). Negative for neck pain and neck stiffness.  Skin: Negative.   Neurological: Negative for dizziness, weakness, numbness and headaches.  Hematological: Negative for adenopathy.  Psychiatric/Behavioral: Negative for confusion.  All other systems reviewed and are  negative.    Physical Exam Updated Vital Signs BP 119/81 (BP Location: Right Arm)   Pulse 90   Temp 98.9 F (37.2 C) (Oral)   Resp 16   Wt 56.7 kg   SpO2 100%   BMI 20.18 kg/m   Physical Exam  Constitutional: She is oriented to person, place, and time. She appears well-developed and well-nourished. No distress.  HENT:  Head: Normocephalic and atraumatic.  Right Ear: Tympanic membrane and ear canal normal.  Left Ear: Tympanic membrane and ear canal normal.  Nose: Mucosal edema and rhinorrhea present.  Mouth/Throat: Uvula is midline and mucous membranes are normal. No trismus in the jaw. No uvula swelling. Posterior oropharyngeal erythema present. No oropharyngeal exudate, posterior oropharyngeal edema or tonsillar abscesses.  Eyes: Conjunctivae are normal.  Neck: Normal range of motion and phonation normal. Neck supple. No Brudzinski's sign and no Kernig's sign noted.  Cardiovascular: Normal rate, regular rhythm and intact distal pulses.   No murmur heard. Pulmonary/Chest: Effort normal. No respiratory distress. She has wheezes. She has no rales.  Few scattered inspiratory wheezes bilaterally.  Lung sounds are coarse.  No rales, no respiratory distress  Abdominal: Soft. She exhibits no distension. There is no tenderness. There is no rebound and no guarding.  Musculoskeletal: Normal range of motion. She exhibits no edema.  Lymphadenopathy:    She has no cervical adenopathy.  Neurological: She is alert and oriented to person, place, and time. She exhibits normal muscle tone. Coordination normal.  Skin: Skin is warm and dry.  Psychiatric: She has a normal mood and affect.  Nursing note and vitals reviewed.    ED Treatments / Results  Labs (all labs ordered are listed, but only abnormal results are displayed) Labs Reviewed - No data to display  EKG  EKG Interpretation None       Radiology Dg Chest 2 View  Result Date: 03/31/2016 CLINICAL DATA:  Cough for 5 days  EXAM: CHEST  2 VIEW COMPARISON:  08/28/2013 FINDINGS: Normal mediastinum and cardiac silhouette. Normal pulmonary vasculature. No evidence of effusion, infiltrate, or pneumothorax. No acute bony abnormality. Anterior cervical fusion. IMPRESSION: No acute cardiopulmonary process. Electronically Signed   By: Suzy Bouchard M.D.   On: 03/31/2016 10:39    Procedures Procedures (including critical care time)  Medications Ordered in ED Medications  ipratropium-albuterol (DUONEB) 0.5-2.5 (3) MG/3ML nebulizer solution 3 mL (3 mLs Nebulization Given 03/31/16 1048)  albuterol (PROVENTIL) (2.5 MG/3ML) 0.083% nebulizer solution 2.5 mg (2.5 mg Nebulization Given 03/31/16 1050)  guaiFENesin-codeine 100-10 MG/5ML solution 10 mL (10 mLs Oral Given 03/31/16 1044)     Initial Impression / Assessment and Plan / ED Course  I have reviewed the triage vital signs and the nursing notes.  Pertinent labs & imaging results that were available during my care of the patient were reviewed by me and considered in my medical decision making (see chart for details).  Clinical Course     Patient is nontoxic  appearing. Vital signs are stable. Chest x-ray negative for pneumonia. Symptoms are likely viral discussed findings with the patient, she agrees to symptomatic treatment and follow-up with her PCP. Prescriptions for albuterol inhaler, anti-tussive, and steroids.  Final Clinical Impressions(s) / ED Diagnoses   Final diagnoses:  Bronchitis    New Prescriptions New Prescriptions   No medications on file     Kem Parkinson, PA-C 03/31/16 [place], MD 04/01/16 1133

## 2016-03-31 NOTE — ED Triage Notes (Signed)
Pt here for cough and cold x5 days.  Pt reports that she has tried everything she could she could think of OTC without any relief.  Pt reports cough is productive for yellow sputum as well as some chills and she states that she is "sore all over" when coughing.

## 2016-03-31 NOTE — Discharge Instructions (Signed)
Tylenol every 4 hrs if needed for fever.  Follow-up with your doctor for recheck.

## 2016-04-13 ENCOUNTER — Emergency Department (HOSPITAL_COMMUNITY): Payer: Medicare Other

## 2016-04-13 ENCOUNTER — Emergency Department (HOSPITAL_COMMUNITY)
Admission: EM | Admit: 2016-04-13 | Discharge: 2016-04-13 | Disposition: A | Discharge status: Home / Self Care | Payer: Medicare Other | Attending: Emergency Medicine | Admitting: Emergency Medicine

## 2016-04-13 ENCOUNTER — Encounter (HOSPITAL_COMMUNITY): Payer: Self-pay | Admitting: Emergency Medicine

## 2016-04-13 DIAGNOSIS — Z7982 Long term (current) use of aspirin: Secondary | ICD-10-CM | POA: Diagnosis not present

## 2016-04-13 DIAGNOSIS — R05 Cough: Secondary | ICD-10-CM | POA: Diagnosis present

## 2016-04-13 DIAGNOSIS — Z87891 Personal history of nicotine dependence: Secondary | ICD-10-CM | POA: Diagnosis not present

## 2016-04-13 DIAGNOSIS — J181 Lobar pneumonia, unspecified organism: Secondary | ICD-10-CM | POA: Insufficient documentation

## 2016-04-13 DIAGNOSIS — J189 Pneumonia, unspecified organism: Secondary | ICD-10-CM

## 2016-04-13 MED ORDER — HYDROCOD POLST-CPM POLST ER 10-8 MG/5ML PO SUER
5.0000 mL | Freq: Once | ORAL | Status: AC
  Administered 2016-04-13: 5 mL via ORAL
  Filled 2016-04-13: qty 5

## 2016-04-13 MED ORDER — STERILE WATER FOR INJECTION IJ SOLN
INTRAMUSCULAR | Status: AC
  Filled 2016-04-13: qty 10

## 2016-04-13 MED ORDER — DEXAMETHASONE 4 MG PO TABS: 4.0000 mg | ORAL_TABLET | Freq: Two times a day (BID) | ORAL | 0 refills | Status: AC

## 2016-04-13 MED ORDER — AZITHROMYCIN 250 MG PO TABS
ORAL_TABLET | ORAL | 0 refills | Status: AC
Start: 2016-04-13 — End: ?

## 2016-04-13 MED ORDER — AZITHROMYCIN 250 MG PO TABS
500.0000 mg | ORAL_TABLET | Freq: Once | ORAL | Status: AC
  Administered 2016-04-13: 500 mg via ORAL
  Filled 2016-04-13: qty 2

## 2016-04-13 MED ORDER — HYDROCODONE-HOMATROPINE 5-1.5 MG/5ML PO SYRP: 5.0000 mL | ORAL_SOLUTION | Freq: Four times a day (QID) | ORAL | 0 refills | Status: AC | PRN

## 2016-04-13 MED ORDER — CEFTRIAXONE SODIUM 1 G IJ SOLR
1.0000 g | Freq: Once | INTRAMUSCULAR | Status: AC
  Administered 2016-04-13: 1 g via INTRAMUSCULAR
  Filled 2016-04-13: qty 10

## 2016-04-13 NOTE — ED Notes (Signed)
DC orders received

## 2016-04-13 NOTE — Discharge Instructions (Signed)
Your x-ray suggest a left lung pneumonia. Please use Zithromax 1 daily starting tomorrow January 13. Use Decadron 2 times daily with food. Continue your albuterol 2 puffs every 4 hours that was previously prescribed. Use Hycodan for cough. This medication may cause drowsiness, and/or lightheadedness. Please do not drive, operate machinery, drink alcohol, or participate in activities requiring concentration when taking this medication.

## 2016-04-13 NOTE — ED Provider Notes (Signed)
[facility] DEPT Provider Note   CSN: [id number] Arrival date & time: 04/13/16  1259     History   Chief Complaint Chief Complaint  Patient presents with  . Cough    HPI Karina Holland is a 71 y.o. female.  Patient is a 71 year old female who presents to the emergency department with cough.  The patient states that over the last 2 weeks she has been having problems with cough. She initially had some temperature elevation, but is not having that problem now. She was diagnosed with upper respiratory symptoms recently she was noted to have some wheezing and was treated for this. The patient states however that her symptoms are getting worse instead of better and she presents to the emergency department for additional evaluation. The patient denies blood in her sputum. She denies high fever. The patient also complains of generalized body aches and chills. There is no been any problem with vomiting. The patient denies high fever, and there's been no unusual shortness of breath reported. There's been no recent operations or procedures involving the chest.   The history is provided by the patient.  Cough  Associated symptoms include chills and myalgias. Pertinent negatives include no chest pain, no shortness of breath and no wheezing.    Past Medical History:  Diagnosis Date  . Arthritis    osteoarthritis.  . Back pain   . Depression   . GERD (gastroesophageal reflux disease)   . Hypercholesteremia     Patient Active Problem List   Diagnosis Date Noted  . GERD 11/30/2009  . 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 WEIGHT LOSS, ABNORMAL 11/30/2009  . CHANGE IN BOWELS 11/30/2009  . ABDOMINAL PAIN, EPIGASTRIC 11/30/2009  . ADENOCARCINOMA, COLON, HX OF 11/30/2009    Past Surgical History:  Procedure Laterality Date  . ABDOMINAL HYSTERECTOMY    . BACK SURGERY    . CATARACT EXTRACTION Bilateral   . LIPOMA EXCISION Right 09/23/2015   Procedure: EXCISON RIGHT BUTTOCK SOFT TISSUE MASS;   Surgeon: Vickie Epley, MD;  Location: [facility];  Service: General;  Laterality: Right;  . NECK SURGERY      OB History    No data available       Home Medications    Prior to Admission medications   Medication Sig Start Date End Date Taking? Authorizing Provider  albuterol (PROVENTIL HFA;VENTOLIN HFA) 108 (90 Base) MCG/ACT inhaler Inhale 1-2 puffs into the lungs every 4 (four) hours as needed for wheezing or shortness of breath. 03/31/16   Tammy Triplett, PA-C  aspirin EC 81 MG tablet Take 81 mg by mouth daily.    Historical Provider, MD  guaiFENesin-codeine (ROBITUSSIN AC) 100-10 MG/5ML syrup Take 10 mLs by mouth 3 (three) times daily as needed. 03/31/16   Tammy Triplett, PA-C  HYDROcodone-acetaminophen (NORCO/VICODIN) 5-325 MG tablet Take one tab po q 4-6 hrs prn pain Patient not taking: Reported on 09/20/2015 09/05/15   Tammy Triplett, PA-C  Multiple Vitamins-Minerals (ONE-A-DAY ENERGY PO) Take 1 tablet by mouth daily.      Historical Provider, MD  omeprazole (PRILOSEC) 40 MG capsule Take 40 mg by mouth daily.  07/14/15   Historical Provider, MD  oxyCODONE-acetaminophen (ROXICET) 5-325 MG tablet Take 1 tablet by mouth every 4 (four) hours as needed for severe pain. 09/23/15   Vickie Epley, MD  predniSONE (DELTASONE) 20 MG tablet Take 2 tablets (40 mg total) by mouth daily. For 5 days 03/31/16   Tammy Triplett, PA-C  sertraline (ZOLOFT) 50 MG tablet Take  1 tablet by mouth daily. 02/03/16   Historical Provider, MD  simvastatin (ZOCOR) 10 MG tablet Take 10 mg by mouth daily at 6 PM.  12/13/14   Historical Provider, MD  venlafaxine (EFFEXOR) 37.5 MG tablet Take 37.5 mg by mouth daily. 02/03/16   Historical Provider, MD    Family History No family history on file.  Social History Social History  Substance Use Topics  . Smoking status: Former Smoker    Packs/day: 0.25    Years: 4.00    Types: Cigarettes    Quit date: 03/23/2015  . Smokeless tobacco: Never Used  . Alcohol use Yes      Comment: patient reports doesn't normally drink alcohol, drank "bootleg" tonight.     Allergies   Patient has no known allergies.   Review of Systems Review of Systems  Constitutional: Positive for chills. Negative for activity change.       All ROS Neg except as noted in HPI  HENT: Positive for congestion. Negative for nosebleeds.   Eyes: Negative for photophobia and discharge.  Respiratory: Positive for cough. Negative for shortness of breath and wheezing.   Cardiovascular: Negative for chest pain and palpitations.  Gastrointestinal: Negative for abdominal pain and blood in stool.  Genitourinary: Negative for dysuria, frequency and hematuria.  Musculoskeletal: Positive for myalgias. Negative for arthralgias, back pain and neck pain.  Skin: Negative.   Neurological: Negative for dizziness, seizures and speech difficulty.  Psychiatric/Behavioral: Negative for confusion and hallucinations.     Physical Exam Updated Vital Signs BP 136/83 (BP Location: Left Arm)   Pulse 82   Temp 98.2 F (36.8 C) (Oral)   Resp 16   Ht 5\' 6"  (1.676 m)   Wt 56.7 kg   SpO2 98%   BMI 20.18 kg/m   Physical Exam  Constitutional: She is oriented to person, place, and time. She appears well-developed and well-nourished.  Non-toxic appearance.  HENT:  Head: Normocephalic.  Right Ear: Tympanic membrane and external ear normal.  Left Ear: Tympanic membrane and external ear normal.  Eyes: EOM and lids are normal. Pupils are equal, round, and reactive to light.  Neck: Normal range of motion. Neck supple. Carotid bruit is not present.  Cardiovascular: Normal rate, regular rhythm, normal heart sounds, intact distal pulses and normal pulses.   Pulmonary/Chest: Breath sounds normal. No respiratory distress.  There is symmetrical rise and fall of the chest. Patient speaks in complete sentences. There is decreased breath sounds at the bases. Few scattered rhonchi noted.  Abdominal: Soft. Bowel sounds are  normal. There is no tenderness. There is no guarding.  Musculoskeletal: Normal range of motion.  Lymphadenopathy:       Head (right side): No submandibular adenopathy present.       Head (left side): No submandibular adenopathy present.    She has no cervical adenopathy.  Neurological: She is alert and oriented to person, place, and time. She has normal strength. No cranial nerve deficit or sensory deficit.  Skin: Skin is warm and dry.  Psychiatric: She has a normal mood and affect. Her speech is normal.  Nursing note and vitals reviewed.    ED Treatments / Results  Labs (all labs ordered are listed, but only abnormal results are displayed) Labs Reviewed - No data to display  EKG  EKG Interpretation None       Radiology Dg Chest 2 View  Result Date: 04/13/2016 CLINICAL DATA:  Cough over the last 2 weeks. EXAM: CHEST  2 VIEW COMPARISON:  03/31/2016 FINDINGS: Heart and mediastinal shadows are normal. The right lung is clear. There is a new patchy infiltrate in the anterior aspect of the left lower lobe consistent with mild pneumonia. No dense consolidation or lobar collapse. No effusions. No significant bone finding. IMPRESSION: New patchy pneumonia left lower lobe. Electronically Signed   By: Nelson Chimes M.D.   On: 04/13/2016 13:58    Procedures Procedures (including critical care time)  Medications Ordered in ED Medications - No data to display   Initial Impression / Assessment and Plan / ED Course  I have reviewed the triage vital signs and the nursing notes.  Pertinent labs & imaging results that were available during my care of the patient were reviewed by me and considered in my medical decision making (see chart for details).  Clinical Course     *I have reviewed nursing notes, vital signs, and all appropriate lab and imaging results for this patient.**  Final Clinical Impressions(s) / ED Diagnoses  Pulse oximetry is 98% on room air. Vital signs within normal  limits. Chest x-ray shows new patchy pneumonia in the left lower lobe.  I discussed the findings of the examination as well as the x-ray with the patient in terms which he understands. The plan at this time is for the patient to be treated with Rocephin here in the emergency department. Prescription for Zithromax also given to the patient.  ` Patient will be treated with Hycodan and steroid. Patient is to follow-up with her primary physician for recheck and any additional management.    Final diagnoses:  Community acquired pneumonia of left lower lobe of lung [facility])    New Prescriptions New Prescriptions   No medications on file     Lily Kocher, PA-C 04/13/16 [place], MD 04/13/16 1547

## 2016-04-13 NOTE — ED Notes (Signed)
Awaiting DC instruction

## 2016-04-13 NOTE — ED Triage Notes (Signed)
Patient state cough x 2 weeks. States was here Saturday and was given cough syrup with no relief.

## 2016-04-13 NOTE — ED Notes (Signed)
Orders for meds

## 2016-04-13 NOTE — ED Notes (Signed)
Awaiting discharge paperwork

## 2016-04-13 NOTE — ED Notes (Signed)
Seen here Sat for same- states no better- has a dry cough that she feels has moved up to her throat.

## 2016-06-08 ENCOUNTER — Emergency Department (HOSPITAL_COMMUNITY)
Admission: EM | Admit: 2016-06-08 | Discharge: 2016-06-08 | Disposition: A | Discharge status: Home / Self Care | Payer: Medicare HMO | Attending: Emergency Medicine | Admitting: Emergency Medicine

## 2016-06-08 ENCOUNTER — Emergency Department (HOSPITAL_COMMUNITY): Payer: Medicare HMO

## 2016-06-08 ENCOUNTER — Encounter (HOSPITAL_COMMUNITY): Payer: Self-pay

## 2016-06-08 DIAGNOSIS — F17210 Nicotine dependence, cigarettes, uncomplicated: Secondary | ICD-10-CM | POA: Diagnosis not present

## 2016-06-08 DIAGNOSIS — R05 Cough: Secondary | ICD-10-CM | POA: Insufficient documentation

## 2016-06-08 DIAGNOSIS — Z7982 Long term (current) use of aspirin: Secondary | ICD-10-CM | POA: Diagnosis not present

## 2016-06-08 DIAGNOSIS — R059 Cough, unspecified: Secondary | ICD-10-CM

## 2016-06-08 MED ORDER — BENZONATATE 100 MG PO CAPS: 100.0000 mg | ORAL_CAPSULE | Freq: Three times a day (TID) | ORAL | 0 refills | Status: AC | PRN

## 2016-06-08 MED ORDER — ALBUTEROL SULFATE HFA 108 (90 BASE) MCG/ACT IN AERS
4.0000 | INHALATION_SPRAY | RESPIRATORY_TRACT | Status: AC
  Administered 2016-06-08: 4 via RESPIRATORY_TRACT
  Filled 2016-06-08: qty 6.7

## 2016-06-08 NOTE — ED Triage Notes (Signed)
Patient reports of cough x3 weeks. Was seen here and dx with pneumonia but was unable to fill prescription. States cough is no better.

## 2016-06-08 NOTE — Discharge Instructions (Signed)
Take the prescription as directed.  Use your albuterol inhaler (2 to 4 puffs) every 4 hours for the next 7 days, then as needed for cough, wheezing, or shortness of breath.  Call your regular medical doctor today to schedule a follow up appointment within the next 2 to 3 days.  Return to the Emergency Department immediately sooner if worsening.

## 2016-06-08 NOTE — ED Provider Notes (Signed)
[facility] DEPT Provider Note   CSN: [id number] Arrival date & time: 06/08/16  1214     History   Chief Complaint Chief Complaint  Patient presents with  . Cough    HPI Karina Holland is a 71 y.o. female.  HPI  Pt was seen at 1225.  Per pt, c/o gradual onset and persistence of constant cough for the past 3 weeks. States last month she was dx pneumonia, rx antibiotic, but did not get the rx filled. States she continues to cough.  Denies fevers, no rash, no CP/SOB, no N/V/D, no abd pain.    Past Medical History:  Diagnosis Date  . Arthritis    osteoarthritis.  . Back pain   . Depression   . GERD (gastroesophageal reflux disease)   . Hypercholesteremia     Patient Active Problem List   Diagnosis Date Noted  . GERD 11/30/2009  . 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 WEIGHT LOSS, ABNORMAL 11/30/2009  . CHANGE IN BOWELS 11/30/2009  . ABDOMINAL PAIN, EPIGASTRIC 11/30/2009  . ADENOCARCINOMA, COLON, HX OF 11/30/2009    Past Surgical History:  Procedure Laterality Date  . ABDOMINAL HYSTERECTOMY    . BACK SURGERY    . CATARACT EXTRACTION Bilateral   . LIPOMA EXCISION Right 09/23/2015   Procedure: EXCISON RIGHT BUTTOCK SOFT TISSUE MASS;  Surgeon: Vickie Epley, MD;  Location: [facility];  Service: General;  Laterality: Right;  . NECK SURGERY      OB History    No data available       Home Medications    Prior to Admission medications   Medication Sig Start Date End Date Taking? Authorizing Provider  albuterol (PROVENTIL HFA;VENTOLIN HFA) 108 (90 Base) MCG/ACT inhaler Inhale 1-2 puffs into the lungs every 4 (four) hours as needed for wheezing or shortness of breath. 03/31/16  Yes Tammy Triplett, PA-C  aspirin EC 81 MG tablet Take 81 mg by mouth daily.   Yes Historical Provider, MD  dexamethasone (DECADRON) 4 MG tablet Take 1 tablet (4 mg total) by mouth 2 (two) times daily with a meal. 04/13/16  Yes Lily Kocher, PA-C  HYDROcodone-acetaminophen (NORCO/VICODIN) 5-325 MG tablet  Take one tab po q 4-6 hrs prn pain 09/05/15  Yes Tammy Triplett, PA-C  Multiple Vitamins-Minerals (ONE-A-DAY ENERGY PO) Take 1 tablet by mouth daily.     Yes Historical Provider, MD  omeprazole (PRILOSEC) 40 MG capsule Take 40 mg by mouth daily.  07/14/15  Yes Historical Provider, MD  sertraline (ZOLOFT) 50 MG tablet Take 1 tablet by mouth daily. 02/03/16  Yes Historical Provider, MD  simvastatin (ZOCOR) 10 MG tablet Take 10 mg by mouth daily at 6 PM.  12/13/14  Yes Historical Provider, MD  azithromycin (ZITHROMAX) 250 MG tablet 1 po daily with food Patient not taking: Reported on 06/08/2016 04/13/16   Lily Kocher, PA-C  guaiFENesin-codeine (ROBITUSSIN AC) 100-10 MG/5ML syrup Take 10 mLs by mouth 3 (three) times daily as needed. Patient not taking: Reported on 06/08/2016 03/31/16   Tammy Triplett, PA-C  HYDROcodone-homatropine (HYCODAN) 5-1.5 MG/5ML syrup Take 5 mLs by mouth every 6 (six) hours as needed. Patient not taking: Reported on 06/08/2016 04/13/16   Lily Kocher, PA-C  oxyCODONE-acetaminophen (ROXICET) 5-325 MG tablet Take 1 tablet by mouth every 4 (four) hours as needed for severe pain. Patient not taking: Reported on 06/08/2016 09/23/15   Vickie Epley, MD  predniSONE (DELTASONE) 20 MG tablet Take 2 tablets (40 mg total) by mouth daily. For 5 days Patient not  taking: Reported on 06/08/2016 03/31/16   Tammy Triplett, PA-C  venlafaxine (EFFEXOR) 37.5 MG tablet Take 37.5 mg by mouth daily. 02/03/16   Historical Provider, MD    Family History No family history on file.  Social History Social History  Substance Use Topics  . Smoking status: Former Smoker    Packs/day: 0.25    Years: 4.00    Types: Cigarettes    Quit date: 03/23/2015  . Smokeless tobacco: Never Used  . Alcohol use Yes     Comment: patient reports doesn't normally drink alcohol, drank "bootleg" tonight.     Allergies   Patient has no known allergies.   Review of Systems Review of Systems ROS: Statement: All systems  negative except as marked or noted in the HPI; Constitutional: Negative for fever and chills. ; ; Eyes: Negative for eye pain, redness and discharge. ; ; ENMT: Negative for ear pain, hoarseness, nasal congestion, sinus pressure and sore throat. ; ; Cardiovascular: Negative for chest pain, palpitations, diaphoresis, dyspnea and peripheral edema. ; ; Respiratory: +cough. Negative for wheezing and stridor. ; ; Gastrointestinal: Negative for nausea, vomiting, diarrhea, abdominal pain, blood in stool, hematemesis, jaundice and rectal bleeding. . ; ; Genitourinary: Negative for dysuria, flank pain and hematuria. ; ; Musculoskeletal: Negative for back pain and neck pain. Negative for swelling and trauma.; ; Skin: Negative for pruritus, rash, abrasions, blisters, bruising and skin lesion.; ; Neuro: Negative for headache, lightheadedness and neck stiffness. Negative for weakness, altered level of consciousness, altered mental status, extremity weakness, paresthesias, involuntary movement, seizure and syncope.       Physical Exam Updated Vital Signs BP 135/76 (BP Location: Left Arm)   Pulse 80   Temp 98 F (36.7 C) (Oral)   Resp 17   Ht 5\' 7"  (1.702 m)   Wt 138 lb (62.6 kg)   SpO2 100%   BMI 21.61 kg/m   Physical Exam 1230: Physical examination:  Nursing notes reviewed; Vital signs and O2 SAT reviewed;  Constitutional: Well developed, Well nourished, Well hydrated, In no acute distress; Head:  Normocephalic, atraumatic; Eyes: EOMI, PERRL, No scleral icterus; ENMT: TM's clear bilat. +edemetous nasal turbinates bilat with clear rhinorrhea. Mouth and pharynx without lesions. No tonsillar exudates. No intra-oral edema. No submandibular or sublingual edema. No hoarse voice, no drooling, no stridor. No pain with manipulation of larynx. No trismus. Mouth and pharynx normal, Mucous membranes moist; Neck: Supple, Full range of motion, No lymphadenopathy; Cardiovascular: Regular rate and rhythm, No gallop;  Respiratory: Breath sounds clear & equal bilaterally, No wheezes.  Speaking full sentences with ease, Normal respiratory effort/excursion; Chest: Nontender, Movement normal; Abdomen: Soft, Nontender, Nondistended, Normal bowel sounds; Genitourinary: No CVA tenderness; Extremities: Pulses normal, No tenderness, No edema, No calf edema or asymmetry.; Neuro: AA&Ox3, Major CN grossly intact.  Speech clear. No gross focal motor or sensory deficits in extremities.; Skin: Color normal, Warm, Dry.   ED Treatments / Results  Labs (all labs ordered are listed, but only abnormal results are displayed)   EKG  EKG Interpretation None       Radiology   Procedures Procedures (including critical care time)  Medications Ordered in ED Medications  albuterol (PROVENTIL HFA;VENTOLIN HFA) 108 (90 Base) MCG/ACT inhaler 4 puff (4 puffs Inhalation Given 06/08/16 1322)     Initial Impression / Assessment and Plan / ED Course  I have reviewed the triage vital signs and the nursing notes.  Pertinent labs & imaging results that were available during my care of  the patient were reviewed by me and considered in my medical decision making (see chart for details).  MDM Reviewed: previous chart, nursing note and vitals Reviewed previous: x-ray Interpretation: x-ray   Dg Chest 2 View Result Date: 06/08/2016 CLINICAL DATA:  Cough. EXAM: CHEST  2 VIEW COMPARISON:  Radiographs of April 13, 2016. FINDINGS: The heart size and mediastinal contours are within normal limits. Both lungs are clear. No pneumothorax or pleural effusion is noted. The visualized skeletal structures are unremarkable. IMPRESSION: No active cardiopulmonary disease. Electronically Signed   By: Marijo Conception, M.D.   On: 06/08/2016 13:00    1350:  CXR reassuring. Tx cough symptomatically at this time. Dx and testing d/w pt and family.  Questions answered.  Verb understanding, agreeable to d/c home with outpt f/u.   Final Clinical  Impressions(s) / ED Diagnoses   Final diagnoses:  None    New Prescriptions New Prescriptions   No medications on file     Francine Graven, DO 06/13/16 1007

## 2016-10-01 ENCOUNTER — Encounter (HOSPITAL_COMMUNITY): Payer: Self-pay | Admitting: Emergency Medicine

## 2016-10-01 ENCOUNTER — Emergency Department (HOSPITAL_COMMUNITY)
Admission: EM | Admit: 2016-10-01 | Discharge: 2016-10-01 | Disposition: A | Discharge status: Home / Self Care | Payer: Medicare Other | Attending: Emergency Medicine | Admitting: Emergency Medicine

## 2016-10-01 DIAGNOSIS — Z87891 Personal history of nicotine dependence: Secondary | ICD-10-CM | POA: Insufficient documentation

## 2016-10-01 DIAGNOSIS — A5901 Trichomonal vulvovaginitis: Secondary | ICD-10-CM | POA: Diagnosis not present

## 2016-10-01 DIAGNOSIS — Z7982 Long term (current) use of aspirin: Secondary | ICD-10-CM | POA: Diagnosis not present

## 2016-10-01 DIAGNOSIS — R1030 Lower abdominal pain, unspecified: Secondary | ICD-10-CM | POA: Diagnosis present

## 2016-10-01 DIAGNOSIS — Z79899 Other long term (current) drug therapy: Secondary | ICD-10-CM | POA: Diagnosis not present

## 2016-10-01 LAB — COMPREHENSIVE METABOLIC PANEL
ALT: 17 U/L (ref 14–54)
AST: 23 U/L (ref 15–41)
Albumin: 4.1 g/dL (ref 3.5–5.0)
Alkaline Phosphatase: 58 U/L (ref 38–126)
Anion gap: 7 (ref 5–15)
BUN: 9 mg/dL (ref 6–20)
CHLORIDE: 105 mmol/L (ref 101–111)
CO2: 28 mmol/L (ref 22–32)
CREATININE: 1.1 mg/dL — AB (ref 0.44–1.00)
Calcium: 9.1 mg/dL (ref 8.9–10.3)
GFR calc Af Amer: 58 mL/min — ABNORMAL LOW (ref >60)
GFR calc non Af Amer: 50 mL/min — ABNORMAL LOW (ref >60)
GLUCOSE: 89 mg/dL (ref 65–99)
Potassium: 3.5 mmol/L (ref 3.5–5.1)
SODIUM: 140 mmol/L (ref 135–145)
Total Bilirubin: 0.7 mg/dL (ref 0.3–1.2)
Total Protein: 7.2 g/dL (ref 6.5–8.1)

## 2016-10-01 LAB — URINALYSIS, ROUTINE W REFLEX MICROSCOPIC
BACTERIA UA: NONE SEEN
Bilirubin Urine: NEGATIVE
Glucose, UA: NEGATIVE mg/dL
HGB URINE DIPSTICK: NEGATIVE
Ketones, ur: NEGATIVE mg/dL
Nitrite: NEGATIVE
PROTEIN: NEGATIVE mg/dL
Specific Gravity, Urine: 1.01 (ref 1.005–1.030)
pH: 6 (ref 5.0–8.0)

## 2016-10-01 LAB — WET PREP, GENITAL
Sperm: NONE SEEN
YEAST WET PREP: NONE SEEN

## 2016-10-01 LAB — CBC WITH DIFFERENTIAL/PLATELET
BASOS ABS: 0 10*3/uL (ref 0.0–0.1)
Basophils Relative: 0 %
EOS PCT: 1 %
Eosinophils Absolute: 0.1 10*3/uL (ref 0.0–0.7)
HCT: 40.5 % (ref 36.0–46.0)
Hemoglobin: 13.2 g/dL (ref 12.0–15.0)
LYMPHS PCT: 39 %
Lymphs Abs: 2.4 10*3/uL (ref 0.7–4.0)
MCH: 29.1 pg (ref 26.0–34.0)
MCHC: 32.6 g/dL (ref 30.0–36.0)
MCV: 89.4 fL (ref 78.0–100.0)
Monocytes Absolute: 0.4 10*3/uL (ref 0.1–1.0)
Monocytes Relative: 7 %
NEUTROS PCT: 53 %
Neutro Abs: 3.3 10*3/uL (ref 1.7–7.7)
PLATELETS: 291 10*3/uL (ref 150–400)
RBC: 4.53 MIL/uL (ref 3.87–5.11)
RDW: 13.2 % (ref 11.5–15.5)
WBC: 6.2 10*3/uL (ref 4.0–10.5)

## 2016-10-01 MED ORDER — METRONIDAZOLE 500 MG PO TABS: 500.0000 mg | ORAL_TABLET | Freq: Two times a day (BID) | ORAL | 0 refills | Status: AC

## 2016-10-01 MED ORDER — LIDOCAINE HCL (PF) 1 % IJ SOLN
INTRAMUSCULAR | Status: AC
  Administered 2016-10-01: 0.9 mL
  Filled 2016-10-01: qty 5

## 2016-10-01 MED ORDER — AZITHROMYCIN 250 MG PO TABS
1000.0000 mg | ORAL_TABLET | Freq: Once | ORAL | Status: AC
  Administered 2016-10-01: 1000 mg via ORAL
  Filled 2016-10-01: qty 4

## 2016-10-01 MED ORDER — METRONIDAZOLE 500 MG PO TABS
500.0000 mg | ORAL_TABLET | Freq: Once | ORAL | Status: AC
  Administered 2016-10-01: 500 mg via ORAL
  Filled 2016-10-01: qty 1

## 2016-10-01 MED ORDER — CEFTRIAXONE SODIUM 250 MG IJ SOLR
250.0000 mg | Freq: Once | INTRAMUSCULAR | Status: AC
  Administered 2016-10-01: 250 mg via INTRAMUSCULAR
  Filled 2016-10-01: qty 250

## 2016-10-01 NOTE — Discharge Instructions (Signed)
Please obtain all of your results from medical records or have your doctors office obtain the results - share them with your doctor - you should be seen at your doctors office in the next 2 days. Call today to arrange your follow up. Take the medications as prescribed. Please review all of the medicines and only take them if you do not have an allergy to them. Please be aware that if you are taking birth control pills, taking other prescriptions, ESPECIALLY ANTIBIOTICS may make the birth control ineffective - if this is the case, either do not engage in sexual activity or use alternative methods of birth control such as condoms until you have finished the medicine and your family doctor says it is OK to restart them. If you are on a blood thinner such as COUMADIN, be aware that any other medicine that you take may cause the coumadin to either work too much, or not enough - you should have your coumadin level rechecked in next 7 days if this is the case.  ?  It is also a possibility that you have an allergic reaction to any of the medicines that you have been prescribed - Everybody reacts differently to medications and while MOST people have no trouble with most medicines, you may have a reaction such as nausea, vomiting, rash, swelling, shortness of breath. If this is the case, please stop taking the medicine immediately and contact your physician.  ?  You should return to the ER if you develop severe or worsening symptoms.   Pelvic Infection  If you have been diagnosed with a pelvic infection such as a sexually transmitted disease, you will need to be treated with antibiotics. Please take the medicines as prescribed. Some of these tests do not come back for 1-2 days in which case if they turn positive you will receive a phone call to let you know. If you are contacted and do have an infection consistent with a sexually transmitted disease, then you will need to tell any and all sexual partners that you have  had in the last 6 months no so that they can be tested and treated as well. If you should develop severe or worsening pain in your abdomen or the pelvis or develop severe fevers,nausea or vomiting that prevent you from taking your medications, return to the emergency department immediately. Otherwise contact your local physician or county health department for a follow up appointment to complete STD testing including HIV and syphilis.  See the list of phone numbers below.  RESOURCE GUIDE  Dental Problems  Patients with Medicaid: [place] [employer] Phone:  [phone]                                                  Phone:  [phone]  If unable to pay or uninsured, contact:  [facility] or [facility]. to become qualified for the adult dental clinic.  Chronic Pain Problems Contact [facility]  [phone] Patients need to be referred by their primary care doctor.  Insufficient Money for Medicine Contact [employer]:  call "211" or [place] [phone].  No Primary Care Doctor Call [facility]  [phone] Other agencies that provide inexpensive medical care    [place]  [phone]    [facility] Internal [facility]  [place]  [phone]    [facility]  [phone]    [facility]  [place]  [place]  [phone] [place]   [phone] (emergency services [phone])  [facility]  [phone] [facility] [phone] [facility] [phone] [place] [place] [phone] Residential & Outpatient Substance Abuse Program  [phone]  Abuse/Neglect [place] [phone] [place] [phone] (After Hours)  Emergency [place] [phone]  [place] at the [place] [phone] [place] [phone]  MRSA Hotline #:   (541)254-9596    [facility] Dept. [address]      [place] Phone:  [phone]                                   Phone:  [phone]                 Phone:  [place] Phone:  [place] [phone] (After Hours)

## 2016-10-01 NOTE — ED Triage Notes (Signed)
Patient complaining of lower abdominal pain with vaginal bleeding x 1 week. Also complaining of bleeding from [place] last night.

## 2016-10-01 NOTE — ED Provider Notes (Signed)
[facility] DEPT Provider Note   CSN: [id number] Arrival date & time: 10/01/16  1037     History   Chief Complaint Chief Complaint  Patient presents with  . Abdominal Pain    HPI Karina Holland is a 71 y.o. female.  HPI  71 y/o female with bleeding from her vagina / urethra or rectum (hemorrhoids) over the last 24 hours - has had some lower abd cramping and dysuria X 1 week.  She denies any fevers chills nausea vomiting swelling of the legs coughing shortness of breath or chest pain. She has never been treated for any significant abdominal processes other than having a partial colectomy secondary to colon cancer but after that she states she was in remission.  Past Medical History:  Diagnosis Date  . Arthritis    osteoarthritis.  . Back pain   . Depression   . GERD (gastroesophageal reflux disease)   . Hypercholesteremia     Patient Active Problem List   Diagnosis Date Noted  . GERD 11/30/2009  . 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 WEIGHT LOSS, ABNORMAL 11/30/2009  . CHANGE IN BOWELS 11/30/2009  . ABDOMINAL PAIN, EPIGASTRIC 11/30/2009  . ADENOCARCINOMA, COLON, HX OF 11/30/2009    Past Surgical History:  Procedure Laterality Date  . ABDOMINAL HYSTERECTOMY    . BACK SURGERY    . CATARACT EXTRACTION Bilateral   . LIPOMA EXCISION Right 09/23/2015   Procedure: EXCISON RIGHT BUTTOCK SOFT TISSUE MASS;  Surgeon: Vickie Epley, MD;  Location: [facility];  Service: General;  Laterality: Right;  . NECK SURGERY      OB History    No data available       Home Medications    Prior to Admission medications   Medication Sig Start Date End Date Taking? Authorizing Provider  albuterol (PROVENTIL HFA;VENTOLIN HFA) 108 (90 Base) MCG/ACT inhaler Inhale 1-2 puffs into the lungs every 4 (four) hours as needed for wheezing or shortness of breath. 03/31/16  Yes Triplett, Tammy, PA-C  aspirin EC 81 MG tablet Take 81 mg by mouth daily.   Yes [provider]  Multiple  Vitamins-Minerals (ONE-A-DAY ENERGY PO) Take 1 tablet by mouth daily.     Yes [provider]  omeprazole (PRILOSEC) 40 MG capsule Take 40 mg by mouth daily.  07/14/15  Yes [provider]  sertraline (ZOLOFT) 50 MG tablet Take 1 tablet by mouth daily. 02/03/16  Yes [provider]  simvastatin (ZOCOR) 10 MG tablet Take 10 mg by mouth daily at 6 PM.  12/13/14  Yes [provider]  venlafaxine (EFFEXOR) 37.5 MG tablet Take 37.5 mg by mouth daily. 02/03/16  Yes [provider]  azithromycin (ZITHROMAX) 250 MG tablet 1 po daily with food Patient not taking: Reported on 06/08/2016 04/13/16   Lily Kocher, PA-C  benzonatate (TESSALON) 100 MG capsule Take 1 capsule (100 mg total) by mouth 3 (three) times daily as needed for cough. Patient not taking: Reported on 10/01/2016 06/08/16   Francine Graven, DO  dexamethasone (DECADRON) 4 MG tablet Take 1 tablet (4 mg total) by mouth 2 (two) times daily with a meal. Patient not taking: Reported on 10/01/2016 04/13/16   Lily Kocher, PA-C  guaiFENesin-codeine (ROBITUSSIN AC) 100-10 MG/5ML syrup Take 10 mLs by mouth 3 (three) times daily as needed. Patient not taking: Reported on 06/08/2016 03/31/16   Kem Parkinson, PA-C  HYDROcodone-acetaminophen (NORCO/VICODIN) 5-325 MG tablet Take one tab po q 4-6 hrs prn pain Patient not taking: Reported on 10/01/2016 09/05/15  Triplett, Tammy, PA-C  HYDROcodone-homatropine (HYCODAN) 5-1.5 MG/5ML syrup Take 5 mLs by mouth every 6 (six) hours as needed. Patient not taking: Reported on 06/08/2016 04/13/16   Lily Kocher, PA-C  oxyCODONE-acetaminophen (ROXICET) 5-325 MG tablet Take 1 tablet by mouth every 4 (four) hours as needed for severe pain. Patient not taking: Reported on 06/08/2016 09/23/15   Vickie Epley, MD  predniSONE (DELTASONE) 20 MG tablet Take 2 tablets (40 mg total) by mouth daily. For 5 days Patient not taking: Reported on 06/08/2016 03/31/16   Kem Parkinson, PA-C    Family  History History reviewed. No pertinent family history.  Social History Social History  Substance Use Topics  . Smoking status: Former Smoker    Packs/day: 0.25    Years: 4.00    Types: Cigarettes    Quit date: 03/23/2015  . Smokeless tobacco: Never Used  . Alcohol use No     Comment: patient reports doesn't normally drink alcohol, drank "bootleg" tonight.     Allergies   Patient has no known allergies.   Review of Systems Review of Systems  All other systems reviewed and are negative.    Physical Exam Updated Vital Signs BP (!) 165/91 (BP Location: Right Arm)   Pulse 76   Temp 98 F (36.7 C) (Oral)   Resp 20   Ht 5\' 6"  (1.676 m)   Wt 63.5 kg (140 lb)   SpO2 100%   BMI 22.60 kg/m   Physical Exam  Constitutional: She appears well-developed and well-nourished. No distress.  HENT:  Head: Normocephalic and atraumatic.  Mouth/Throat: Oropharynx is clear and moist. No oropharyngeal exudate.  Eyes: Conjunctivae and EOM are normal. Pupils are equal, round, and reactive to light. Right eye exhibits no discharge. Left eye exhibits no discharge. No scleral icterus.  Neck: Normal range of motion. Neck supple. No JVD present. No thyromegaly present.  Cardiovascular: Normal rate, regular rhythm, normal heart sounds and intact distal pulses.  Exam reveals no gallop and no friction rub.   No murmur heard. Pulmonary/Chest: Effort normal and breath sounds normal. No respiratory distress. She has no wheezes. She has no rales.  Abdominal: Soft. Bowel sounds are normal. She exhibits no distension and no mass. There is tenderness ( Mild tenderness in the lower abdomen both suprapubic left lower and right lower quadrants. No guarding, no peritoneal signs).  Genitourinary:  Genitourinary Comments: Chaperone present for exam Has normal rectal exam without blood in stool - normal brown stool.  Vaginal exam without cervix, has frothy vag d/c.  She has no bleeding anywhere, no FB.  No ttp  over urethra - normal external genitalia  Musculoskeletal: Normal range of motion. She exhibits no edema or tenderness.  Lymphadenopathy:    She has no cervical adenopathy.  Neurological: She is alert. Coordination normal.  Skin: Skin is warm and dry. No rash noted. No erythema.  Psychiatric: She has a normal mood and affect. Her behavior is normal.  Nursing note and vitals reviewed.    ED Treatments / Results  Labs (all labs ordered are listed, but only abnormal results are displayed) Labs Reviewed  WET PREP, GENITAL - Abnormal; Notable for the following:       Result Value   Trich, Wet Prep PRESENT (*)    Clue Cells Wet Prep HPF POC PRESENT (*)    WBC, Wet Prep HPF POC MANY (*)    All other components within normal limits  COMPREHENSIVE METABOLIC PANEL - Abnormal; Notable for the following:  Creatinine, Ser 1.10 (*)    GFR calc non Af Amer 50 (*)    GFR calc Af Amer 58 (*)    All other components within normal limits  URINALYSIS, ROUTINE W REFLEX MICROSCOPIC - Abnormal; Notable for the following:    Leukocytes, UA SMALL (*)    Squamous Epithelial / LPF 0-5 (*)    All other components within normal limits  URINE CULTURE  CBC WITH DIFFERENTIAL/PLATELET  RPR  HIV ANTIBODY (ROUTINE TESTING)  GC/CHLAMYDIA PROBE AMP ([place]) NOT AT [facility]    Radiology No results found.  Procedures Procedures (including critical care time)  Medications Ordered in ED Medications  metroNIDAZOLE (FLAGYL) tablet 500 mg (not administered)  cefTRIAXone (ROCEPHIN) injection 250 mg (not administered)  azithromycin (ZITHROMAX) tablet 1,000 mg (not administered)     Initial Impression / Assessment and Plan / ED Course  I have reviewed the triage vital signs and the nursing notes.  Pertinent labs & imaging results that were available during my care of the patient were reviewed by me and considered in my medical decision making (see chart for details).  Clinical Course as of Oct 02 1339    Mon Oct 01, 2016  1318 HCT: 40.5 [BM]    Clinical Course User Index [BM] Noemi Chapel, MD    Labs are unremarkable other than presence of Dalbert Batman - states that she is only sexually active with husband.  Will start meds for same and other STD's - she has been instructed to tell her husband and have him get tested and treated   Labs otherwise show no acute findings.  No bleeding by rectum on exam or vaginal exam.  Final Clinical Impressions(s) / ED Diagnoses   Final diagnoses:  Trichomonas vaginitis    New Prescriptions New Prescriptions   No medications on file     Noemi Chapel, MD 10/01/16 1341

## 2016-10-02 LAB — URINE CULTURE: Culture: NO GROWTH

## 2016-10-02 LAB — RPR: RPR: NONREACTIVE

## 2016-10-02 LAB — GC/CHLAMYDIA PROBE AMP (~~LOC~~) NOT AT ARMC
Chlamydia: NEGATIVE
NEISSERIA GONORRHEA: NEGATIVE

## 2016-10-02 LAB — HIV ANTIBODY (ROUTINE TESTING W REFLEX): HIV Screen 4th Generation wRfx: NONREACTIVE

## 2017-01-31 ENCOUNTER — Other Ambulatory Visit (HOSPITAL_COMMUNITY): Payer: Self-pay | Admitting: Obstetrics and Gynecology

## 2017-01-31 DIAGNOSIS — Z1231 Encounter for screening mammogram for malignant neoplasm of breast: Secondary | ICD-10-CM

## 2017-02-14 ENCOUNTER — Ambulatory Visit (HOSPITAL_COMMUNITY): Payer: Medicare HMO

## 2017-02-15 ENCOUNTER — Ambulatory Visit (HOSPITAL_COMMUNITY)
Admission: RE | Admit: 2017-02-15 | Discharge: 2017-02-15 | Disposition: A | Discharge status: Home / Self Care | Payer: Medicare Other | Source: Ambulatory Visit | Attending: Obstetrics and Gynecology | Admitting: Obstetrics and Gynecology

## 2017-02-15 DIAGNOSIS — Z1231 Encounter for screening mammogram for malignant neoplasm of breast: Secondary | ICD-10-CM | POA: Diagnosis not present

## 2017-06-20 IMAGING — CT CT ABD-PELV W/ CM
2 of 5 series · 15 of 46 positions shown, 17 images · IV contrast (iopamidol)
Comparison: CT Abdomen and Pelvis 09/23/2009.

CLINICAL DATA: 70-year-old female with fever and diarrhea. Initial
encounter.

EXAM:
CT ABDOMEN AND PELVIS WITH CONTRAST
TECHNIQUE: Multidetector CT imaging of the abdomen and pelvis was performed
using the standard protocol following bolus administration of
intravenous contrast.
CONTRAST:  100mL [id number] IOPAMIDOL ([id number]) INJECTION 61%

[Series 2: routine abd pel with · axial · 0.64mm/px · z∈[-461,-36]mm · 12 of 95 slices shown, 14 images]
[im 5/95  soft-tissue]
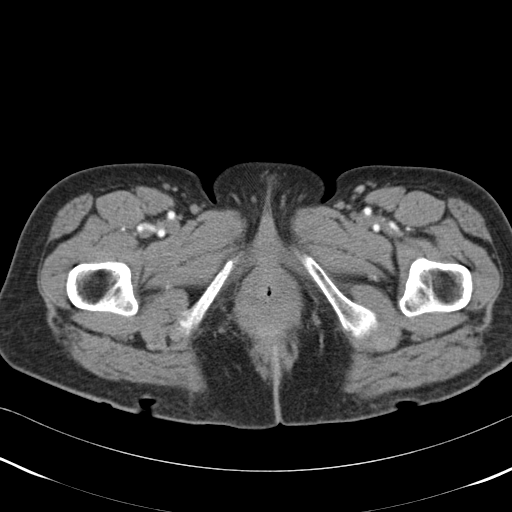
[im 5/95  bone]
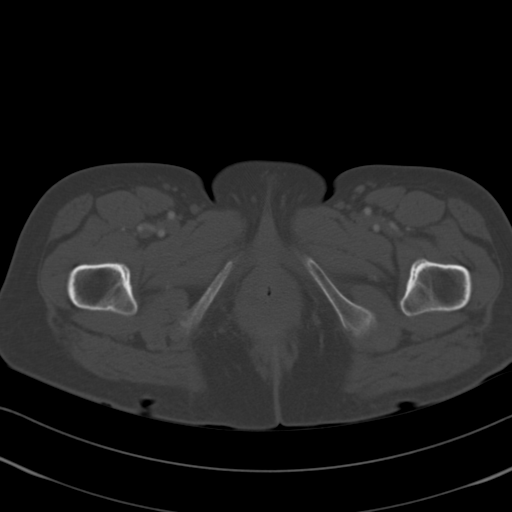
[im 15/95  soft-tissue]
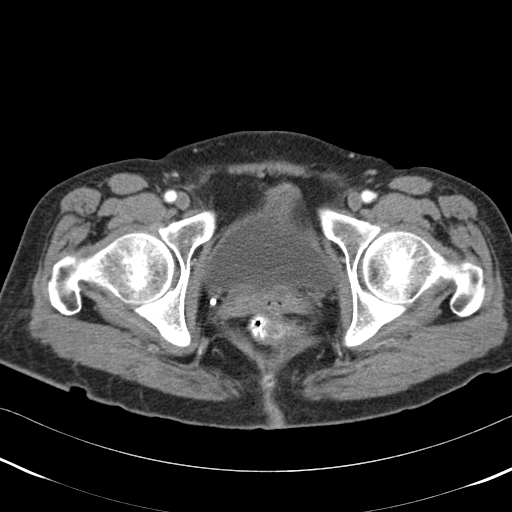
[im 20/95  soft-tissue]
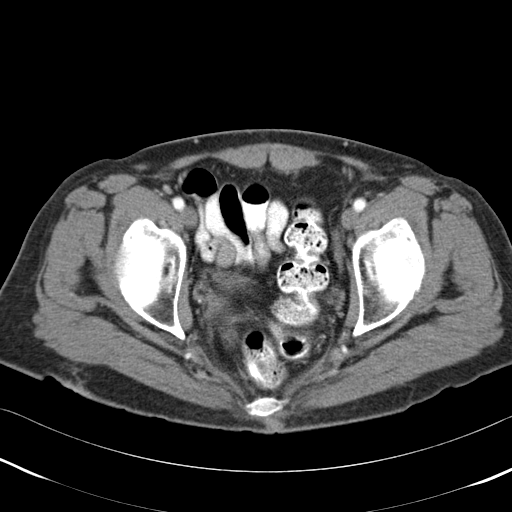
[im 30/95  soft-tissue]
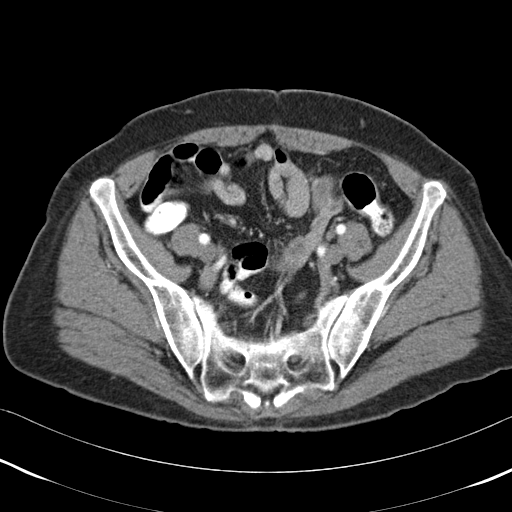
[im 35/95  soft-tissue]
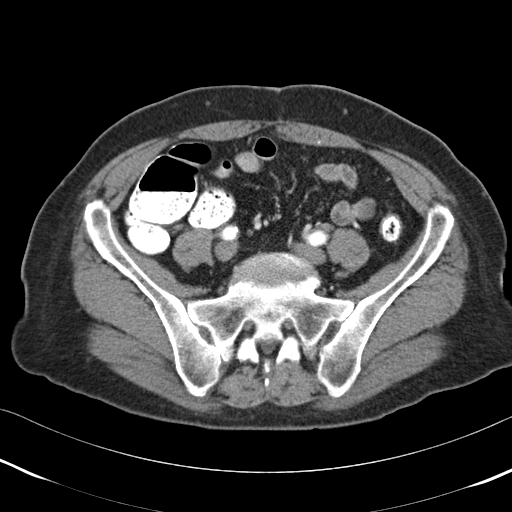
[im 45/95  soft-tissue]
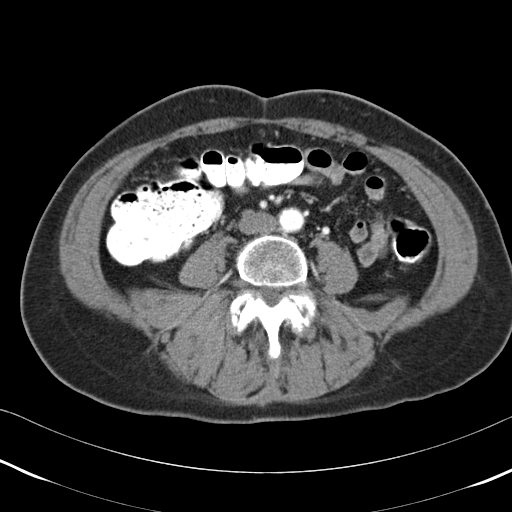
[im 50/95  soft-tissue]
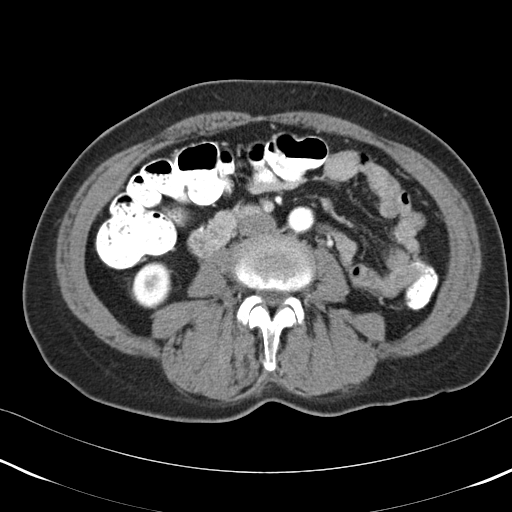
[im 60/95  soft-tissue]
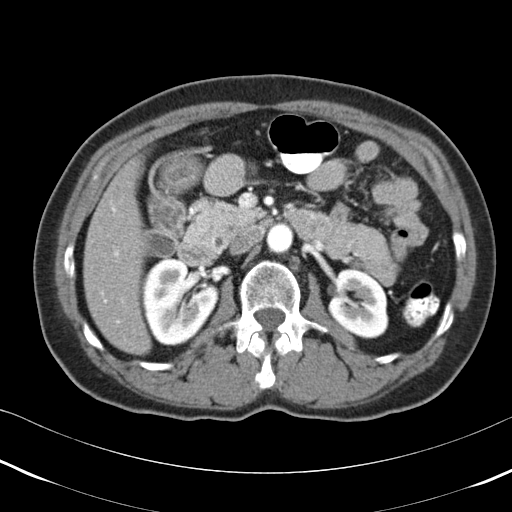
[im 65/95  soft-tissue]
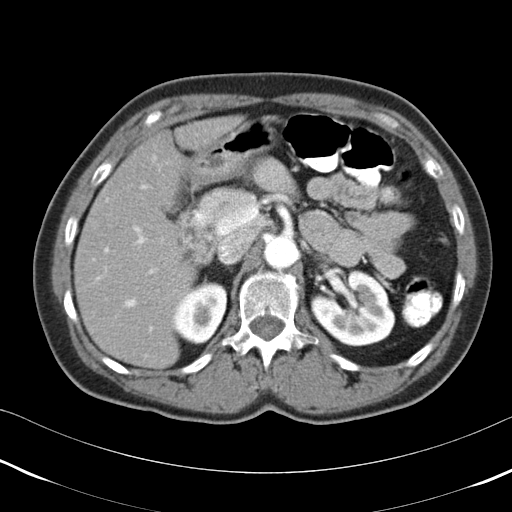
[im 65/95  bone]
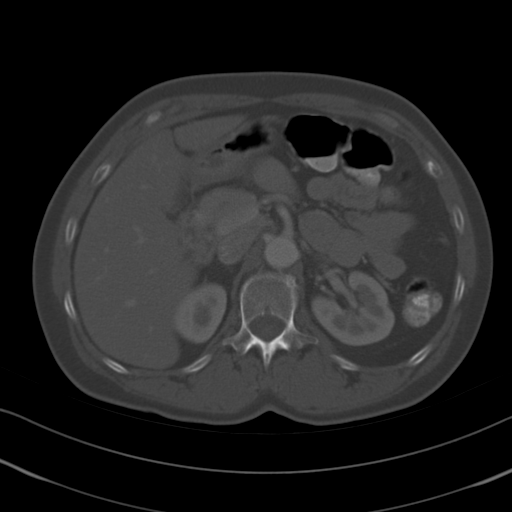
[im 75/95  soft-tissue]
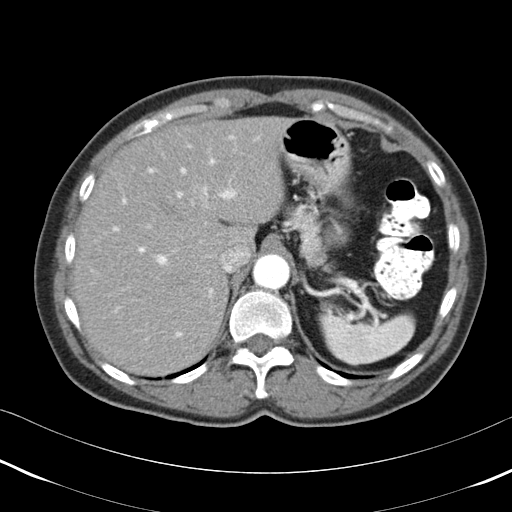
[im 80/95  soft-tissue]
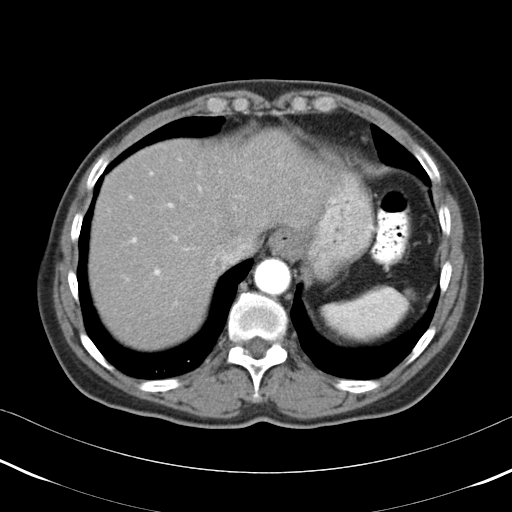
[im 90/95  soft-tissue]
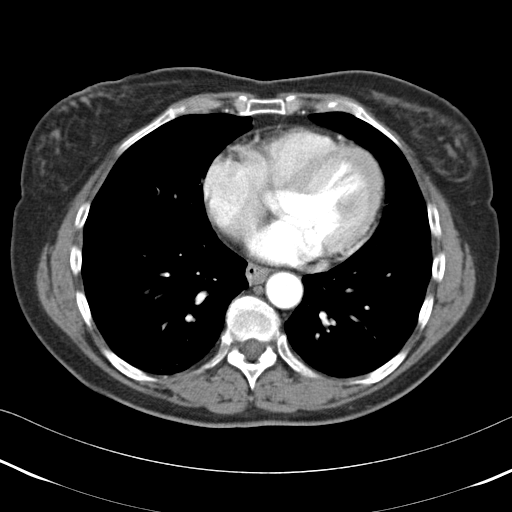

[Series 3: coronal · coronal · 0.67mm/px · 3 of 115 slices shown]
[im 39/115  soft-tissue]
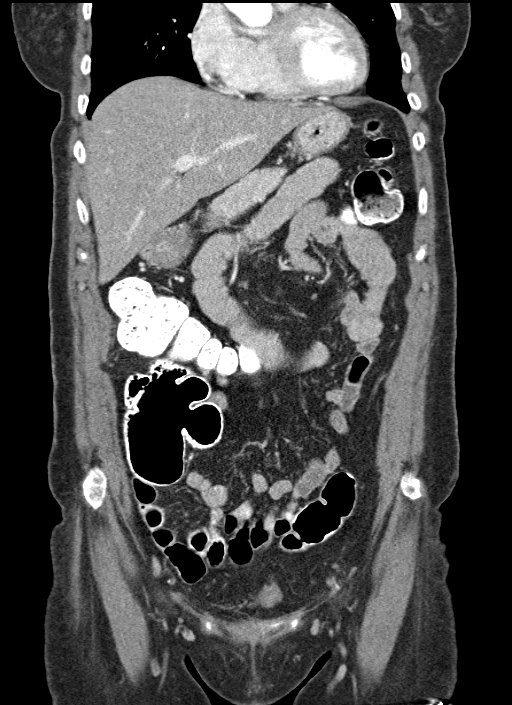
[im 51/115  soft-tissue]
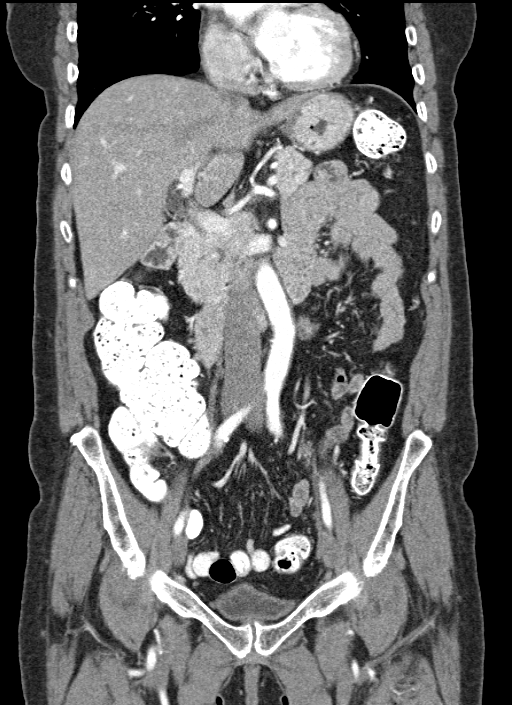
[im 64/115  soft-tissue]
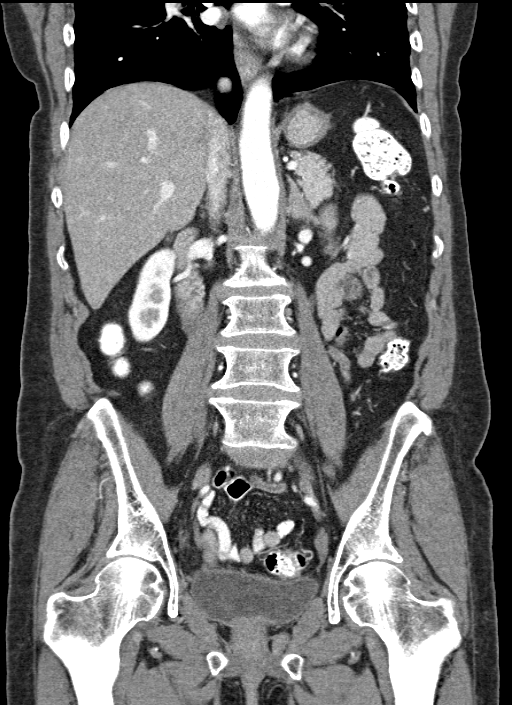

[15 of 46 positions shown; findings below may reference images not displayed]

FINDINGS: Negative lung bases; mild dependent atelectasis similar to that in
6555. No pericardial or pleural effusion.

Chronic lower lumbar spine degeneration. No acute osseous
abnormality identified.

Oral contrast has reached the rectum without obstruction. No pelvic
free fluid. Surgically absent uterus. Adnexa diminutive and within
normal limits. Diminutive urinary bladder.

Redundant but otherwise negative distal colon. Negative left colon.
Mildly redundant but otherwise negative transverse colon. The right
colon is distended with oral contrast, an the cecum appears to
beyond a somewhat lax mesentery, but otherwise the right colon is
negative. Appendix is diminutive or absent. No dilated small bowel.
Decompressed stomach. Negative duodenum. No abdominal free air or
free fluid. Decreased density throughout the liver since 6555
suggesting a degree of steatosis. Negative gallbladder, spleen,
pancreas and adrenal glands. Portal venous system is patent. Major
arterial structures are patent. Mild Aortoiliac calcified
atherosclerosis noted. Bilateral renal enhancement and contrast
excretion is normal. No lymphadenopathy. No mesenteric stranding.

Along the posterior right buttock there is a small stellate area of
subcutaneous stranding with a tiny 7 mm central low-density area.
See series 2, image 74 and coronal image 99. This is new since 6555.
The underlying gluteal muscle appears to remain normal. There is
associated skin thickening overlying this area.
IMPRESSION: 1. A 2 cm skin subcutaneous lesion over lying the right buttock is
nonspecific but might be a tiny soft tissue abscess, or perhaps a
resolving hematoma. See series 2, image 74. Recommend correlation
with clinical exam. Limited Ultrasound of the area might be valuable
if further imaging is desired.
2. No acute or inflammatory process identified within abdomen or
pelvis. Largely stable and unremarkable abdominal and pelvic viscera
compared to 6555.

## 2017-09-13 ENCOUNTER — Other Ambulatory Visit: Payer: Self-pay | Admitting: Orthopedic Surgery

## 2017-09-13 DIAGNOSIS — M5416 Radiculopathy, lumbar region: Secondary | ICD-10-CM

## 2017-10-01 ENCOUNTER — Ambulatory Visit
Admission: RE | Admit: 2017-10-01 | Discharge: 2017-10-01 | Disposition: A | Discharge status: Home / Self Care | Payer: Medicare HMO | Source: Ambulatory Visit | Attending: Orthopedic Surgery | Admitting: Orthopedic Surgery

## 2018-10-14 ENCOUNTER — Encounter (HOSPITAL_COMMUNITY): Payer: Self-pay

## 2018-10-14 ENCOUNTER — Emergency Department (HOSPITAL_COMMUNITY): Payer: Medicare Other

## 2018-10-14 ENCOUNTER — Emergency Department (HOSPITAL_COMMUNITY)
Admission: EM | Admit: 2018-10-14 | Discharge: 2018-10-14 | Disposition: A | Discharge status: Home / Self Care | Payer: Medicare Other | Attending: Emergency Medicine | Admitting: Emergency Medicine

## 2018-10-14 ENCOUNTER — Other Ambulatory Visit: Payer: Self-pay

## 2018-10-14 DIAGNOSIS — E782 Mixed hyperlipidemia: Secondary | ICD-10-CM | POA: Diagnosis not present

## 2018-10-14 DIAGNOSIS — R200 Anesthesia of skin: Secondary | ICD-10-CM | POA: Diagnosis not present

## 2018-10-14 DIAGNOSIS — M542 Cervicalgia: Secondary | ICD-10-CM | POA: Insufficient documentation

## 2018-10-14 DIAGNOSIS — Z79899 Other long term (current) drug therapy: Secondary | ICD-10-CM | POA: Insufficient documentation

## 2018-10-14 DIAGNOSIS — M545 Low back pain: Secondary | ICD-10-CM | POA: Diagnosis present

## 2018-10-14 DIAGNOSIS — Z87891 Personal history of nicotine dependence: Secondary | ICD-10-CM | POA: Insufficient documentation

## 2018-10-14 DIAGNOSIS — Z9181 History of falling: Secondary | ICD-10-CM | POA: Insufficient documentation

## 2018-10-14 DIAGNOSIS — G8929 Other chronic pain: Secondary | ICD-10-CM | POA: Diagnosis not present

## 2018-10-14 DIAGNOSIS — Z7982 Long term (current) use of aspirin: Secondary | ICD-10-CM | POA: Insufficient documentation

## 2018-10-14 MED ORDER — HYDROCODONE-ACETAMINOPHEN 5-325 MG PO TABS: ORAL_TABLET | ORAL | 0 refills | Status: AC

## 2018-10-14 NOTE — Discharge Instructions (Addendum)
Take the prescription as directed.  Apply moist heat or ice to the area(s) of discomfort, for 15 minutes at a time, several times per day for the next few days.  Do not fall asleep on a heating or ice pack.  Call your regular medical doctor tomorrow to schedule a follow up appointment in the next 2 days.  Return to the Emergency Department immediately if worsening. ° °

## 2018-10-14 NOTE — ED Notes (Signed)
Pt refused final VS

## 2018-10-14 NOTE — ED Triage Notes (Signed)
Pt reports back pain.  Pt says has history of arthritis "all over" and pinched nerve in back.  Reports pain radiates down left leg.  For the past 2 months has had difficulty walking, has been falling a lot, and has had numbness in left leg.  Pt says her pcp told her to come here.

## 2018-10-14 NOTE — ED Provider Notes (Signed)
[facility] EMERGENCY DEPARTMENT Provider Note   CSN: [id number] Arrival date & time: 10/14/18  1519     History   Chief Complaint Chief Complaint  Patient presents with   Back Pain    HPI Karina Holland is a 73 y.o. female.     HPI  Karina Holland was seen at 1720. Per Karina Holland, c/o gradual onset and persistence of constant chronic low back "pain" for the past 2 to 3 months. Karina Holland describes the pain as "my arthritis," with radiation down her left leg.  Denies any change in her usual chronic pain pattern over the past several months.  Pain worsens with palpation of the area and body position changes.  Karina Holland states "now my neck has the arthritis," and c/o bilat trapezius areas "pain" for the past 3 days. Karina Holland states she feels this pain "down my arms."  Karina Holland states her PMD sent her to the ED "to get some MRI's." Karina Holland apparently had outpatient MRI schedules but did not go to the appointment. Denies incont/retention of bowel or bladder, no saddle anesthesia, no focal motor weakness, no tingling/numbness in extremities, no fevers, no injury, no abd pain, no CP/SOB.  The symptoms have been associated with no other complaints.    Past Medical History:  Diagnosis Date   Arthritis    osteoarthritis.   Back pain    Depression    GERD (gastroesophageal reflux disease)    Hypercholesteremia     Patient Active Problem List   Diagnosis Date Noted   GERD 11/30/2009   ANOREXIA 11/30/2009   WEIGHT LOSS, ABNORMAL 11/30/2009   CHANGE IN BOWELS 11/30/2009   ABDOMINAL PAIN, EPIGASTRIC 11/30/2009   ADENOCARCINOMA, COLON, HX OF 11/30/2009    Past Surgical History:  Procedure Laterality Date   ABDOMINAL HYSTERECTOMY     BACK SURGERY     CATARACT EXTRACTION Bilateral    LIPOMA EXCISION Right 09/23/2015   Procedure: EXCISON RIGHT BUTTOCK SOFT TISSUE MASS;  Surgeon: Vickie Epley, MD;  Location: [facility];  Service: General;  Laterality: Right;   NECK SURGERY       OB History   No obstetric history  on file.      Home Medications    Prior to Admission medications   Medication Sig Start Date End Date Taking? Authorizing Provider  albuterol (PROVENTIL HFA;VENTOLIN HFA) 108 (90 Base) MCG/ACT inhaler Inhale 1-2 puffs into the lungs every 4 (four) hours as needed for wheezing or shortness of breath. 03/31/16   Triplett, Tammy, PA-C  aspirin EC 81 MG tablet Take 81 mg by mouth daily.    [provider]  azithromycin (ZITHROMAX) 250 MG tablet 1 po daily with food Patient not taking: Reported on 06/08/2016 04/13/16   Lily Kocher, PA-C  benzonatate (TESSALON) 100 MG capsule Take 1 capsule (100 mg total) by mouth 3 (three) times daily as needed for cough. Patient not taking: Reported on 10/01/2016 06/08/16   Francine Graven, DO  dexamethasone (DECADRON) 4 MG tablet Take 1 tablet (4 mg total) by mouth 2 (two) times daily with a meal. Patient not taking: Reported on 10/01/2016 04/13/16   Lily Kocher, PA-C  guaiFENesin-codeine (ROBITUSSIN AC) 100-10 MG/5ML syrup Take 10 mLs by mouth 3 (three) times daily as needed. Patient not taking: Reported on 06/08/2016 03/31/16   Kem Parkinson, PA-C  HYDROcodone-acetaminophen (NORCO/VICODIN) 5-325 MG tablet Take one tab po q 4-6 hrs prn pain Patient not taking: Reported on 10/01/2016 09/05/15   Triplett, Tammy, PA-C  HYDROcodone-homatropine (HYCODAN) 5-1.5 MG/5ML  syrup Take 5 mLs by mouth every 6 (six) hours as needed. Patient not taking: Reported on 06/08/2016 04/13/16   Lily Kocher, PA-C  metroNIDAZOLE (FLAGYL) 500 MG tablet Take 1 tablet (500 mg total) by mouth 2 (two) times daily. 10/01/16   Noemi Chapel, MD  Multiple Vitamins-Minerals (ONE-A-DAY ENERGY PO) Take 1 tablet by mouth daily.      [provider]  omeprazole (PRILOSEC) 40 MG capsule Take 40 mg by mouth daily.  07/14/15   [provider]  oxyCODONE-acetaminophen (ROXICET) 5-325 MG tablet Take 1 tablet by mouth every 4 (four) hours as needed for severe pain. Patient not taking:  Reported on 06/08/2016 09/23/15   Vickie Epley, MD  predniSONE (DELTASONE) 20 MG tablet Take 2 tablets (40 mg total) by mouth daily. For 5 days Patient not taking: Reported on 06/08/2016 03/31/16   Triplett, Tammy, PA-C  sertraline (ZOLOFT) 50 MG tablet Take 1 tablet by mouth daily. 02/03/16   [provider]  simvastatin (ZOCOR) 10 MG tablet Take 10 mg by mouth daily at 6 PM.  12/13/14   [provider]  venlafaxine (EFFEXOR) 37.5 MG tablet Take 37.5 mg by mouth daily. 02/03/16   [provider]    Family History No family history on file.  Social History Social History   Tobacco Use   Smoking status: Former Smoker    Packs/day: 0.25    Years: 4.00    Pack years: 1.00    Types: Cigarettes    Quit date: 03/23/2015    Years since quitting: 3.5   Smokeless tobacco: Never Used  Substance Use Topics   Alcohol use: No   Drug use: No     Allergies   Patient has no known allergies.   Review of Systems Review of Systems ROS: Statement: All systems negative except as marked or noted in the HPI; Constitutional: Negative for fever and chills. ; ; Eyes: Negative for eye pain, redness and discharge. ; ; ENMT: Negative for ear pain, hoarseness, nasal congestion, sinus pressure and sore throat. ; ; Cardiovascular: Negative for chest pain, palpitations, diaphoresis, dyspnea and peripheral edema. ; ; Respiratory: Negative for cough, wheezing and stridor. ; ; Gastrointestinal: Negative for nausea, vomiting, diarrhea, abdominal pain, blood in stool, hematemesis, jaundice and rectal bleeding. . ; ; Genitourinary: Negative for dysuria, flank pain and hematuria. ; ; Musculoskeletal:   +low back pain and neck pain. Negative for swelling and trauma.; ; Skin: Negative for pruritus, rash, abrasions, blisters, bruising and skin lesion.; ; Neuro: Negative for headache, lightheadedness and neck stiffness. Negative for weakness, altered level of consciousness, altered mental status,  extremity weakness, paresthesias, involuntary movement, seizure and syncope.       Physical Exam Updated Vital Signs BP 130/88    Pulse 80    Temp 98.3 F (36.8 C) (Oral)    Resp 16    Ht 5\' 6"  (1.676 m)    Wt 67.1 kg    SpO2 100%    BMI 23.89 kg/m   Physical Exam 1725: Physical examination:  Nursing notes reviewed; Vital signs and O2 SAT reviewed;  Constitutional: Well developed, Well nourished, Well hydrated, In no acute distress; Head:  Normocephalic, atraumatic; Eyes: EOMI, PERRL, No scleral icterus; ENMT: Mouth and pharynx normal, Mucous membranes moist; Neck: Supple, Full range of motion, No lymphadenopathy; Cardiovascular: Regular rate and rhythm, No gallop; Respiratory: Breath sounds clear & equal bilaterally, No wheezes.  Speaking full sentences with ease, Normal respiratory effort/excursion; Chest: Nontender, Movement normal;  Abdomen: Soft, Nontender, Nondistended, Normal bowel sounds; Genitourinary: No CVA tenderness; Spine:  No midline CS, TS, LS tenderness. +TTP bilat hypertonic trapezius muscles. +TTP left lower lumbar paraspinal muscles. No rash.  Extremities: Peripheral pulses normal, No tenderness, No edema, No calf edema or asymmetry.; Neuro: AA&Ox3, Major CN grossly intact. No facial droop. Speech clear. No gross focal motor or sensory deficits in extremities. Strength 5/5 equal bilat UE's and LE's, including great toe dorsiflexion.  DTR 2/4 equal bilat UE's and LE's.  No gross sensory deficits.  Neg straight leg raises bilat. Climbs on and off stretcher easily by herself. Gait steady with her cane..; Skin: Color normal, Warm, Dry.      ED Treatments / Results  Labs (all labs ordered are listed, but only abnormal results are displayed)   EKG None  Radiology   Procedures Procedures (including critical care time)  Medications Ordered in ED Medications - No data to display   Initial Impression / Assessment and Plan / ED Course  I have reviewed the triage vital  signs and the nursing notes.  Pertinent labs & imaging results that were available during my care of the patient were reviewed by me and considered in my medical decision making (see chart for details).     MDM Reviewed: previous chart, nursing note and vitals Interpretation: x-ray and CT scan    Dg Chest 2 View Result Date: 10/14/2018 CLINICAL DATA:  Difficulty walking.  Numbness in left leg. EXAM: CHEST - 2 VIEW COMPARISON:  June 08, 2016 FINDINGS: Cardiomediastinal silhouette is normal. Mediastinal contours appear intact. There is no evidence of focal airspace consolidation, pleural effusion or pneumothorax. Osseous structures are without acute abnormality. Soft tissues are grossly normal. IMPRESSION: No active cardiopulmonary disease. Electronically Signed   By: Fidela Salisbury M.D.   On: 10/14/2018 18:49   Ct Head Wo Contrast Result Date: 10/14/2018 CLINICAL DATA:  Frequent falls. EXAM: CT HEAD WITHOUT CONTRAST CT CERVICAL SPINE WITHOUT CONTRAST TECHNIQUE: Multidetector CT imaging of the head and cervical spine was performed following the standard protocol without intravenous contrast. Multiplanar CT image reconstructions of the cervical spine were also generated. COMPARISON:  None. FINDINGS: CT HEAD FINDINGS Brain: No evidence of acute hemorrhage, hydrocephalus, extra-axial collection or mass lesion/mass effect. Asymmetric area of hypoattenuation in the subcortical/periventricular left parietal lobe. Vascular: No hyperdense vessel or unexpected calcification. Skull: Normal. Negative for fracture or focal lesion. Sinuses/Orbits: No acute finding. Other: None. CT CERVICAL SPINE FINDINGS Alignment: Minimal anterolisthesis of C5 on C6. Skull base and vertebrae: Prior C3-C5 anterior fusion. Intact hardware components without evidence of loosening. Soft tissues and spinal canal: No prevertebral fluid or swelling. No visible canal hematoma. Disc levels: Osteoarthritic changes above and below the  fusion. Moderate to severe posterior facet arthropathy of the mid cervical spine. C3-C4 bilateral osseous neural foraminal narrowing, moderate. C4-C5, bilateral osseous neural foraminal narrowing, moderate. C5-C6 bilateral mild osseous neural foraminal narrowing. Upper chest: Negative. Other: None. IMPRESSION: 1. Asymmetric area of hypoattenuation in the subcortical/periventricular left parietal lobe, which may represent asymmetric microangiopathy. 2. No evidence of intracranial hemorrhage. 3. No evidence of acute traumatic injury to the cervical spine. 4. Prior C3-C5 anterior fusion, with no evidence of hardware loosening. 5. Moderate to severe osteoarthritic changes and posterior facet arthropathy of the cervical spine, with multilevel osseous neural foraminal narrowing. Electronically Signed   By: Fidela Salisbury M.D.   On: 10/14/2018 19:14   Ct Cervical Spine Wo Contrast Result Date: 10/14/2018 CLINICAL DATA:  Frequent falls. EXAM:  CT HEAD WITHOUT CONTRAST CT CERVICAL SPINE WITHOUT CONTRAST TECHNIQUE: Multidetector CT imaging of the head and cervical spine was performed following the standard protocol without intravenous contrast. Multiplanar CT image reconstructions of the cervical spine were also generated. COMPARISON:  None. FINDINGS: CT HEAD FINDINGS Brain: No evidence of acute hemorrhage, hydrocephalus, extra-axial collection or mass lesion/mass effect. Asymmetric area of hypoattenuation in the subcortical/periventricular left parietal lobe. Vascular: No hyperdense vessel or unexpected calcification. Skull: Normal. Negative for fracture or focal lesion. Sinuses/Orbits: No acute finding. Other: None. CT CERVICAL SPINE FINDINGS Alignment: Minimal anterolisthesis of C5 on C6. Skull base and vertebrae: Prior C3-C5 anterior fusion. Intact hardware components without evidence of loosening. Soft tissues and spinal canal: No prevertebral fluid or swelling. No visible canal hematoma. Disc levels:  Osteoarthritic changes above and below the fusion. Moderate to severe posterior facet arthropathy of the mid cervical spine. C3-C4 bilateral osseous neural foraminal narrowing, moderate. C4-C5, bilateral osseous neural foraminal narrowing, moderate. C5-C6 bilateral mild osseous neural foraminal narrowing. Upper chest: Negative. Other: None. IMPRESSION: 1. Asymmetric area of hypoattenuation in the subcortical/periventricular left parietal lobe, which may represent asymmetric microangiopathy. 2. No evidence of intracranial hemorrhage. 3. No evidence of acute traumatic injury to the cervical spine. 4. Prior C3-C5 anterior fusion, with no evidence of hardware loosening. 5. Moderate to severe osteoarthritic changes and posterior facet arthropathy of the cervical spine, with multilevel osseous neural foraminal narrowing. Electronically Signed   By: Fidela Salisbury M.D.   On: 10/14/2018 19:14   Ct Lumbar Spine Wo Contrast Result Date: 10/14/2018 CLINICAL DATA:  Low back pain radiating down the left leg with numbness and frequent falls. EXAM: CT LUMBAR SPINE WITHOUT CONTRAST TECHNIQUE: Multidetector CT imaging of the lumbar spine was performed without intravenous contrast administration. Multiplanar CT image reconstructions were also generated. COMPARISON:  CT abdomen and pelvis 11/11/2015 FINDINGS: Segmentation: Standard. Alignment: Trace anterolisthesis of L4 on L5, facet mediated. Vertebrae: No fracture or suspicious osseous lesion. No endplate destruction to suggest discitis. Paraspinal and other soft tissues: Aortic atherosclerosis. Partial right-sided colectomy with ileocolic anastomosis in the right lower quadrant. Disc levels: Preserved disc space heights. L1-2: Slight facet hypertrophy.  No stenosis. L2-3: Mild disc bulging and mild facet hypertrophy without stenosis. L3-4: Mild disc bulging, mild facet and ligamentum flavum hypertrophy, and mildly prominent dorsal epidural fat result in mild spinal stenosis  without neural foraminal stenosis. L4-5: Anterolisthesis with bulging uncovered disc, ligamentum flavum hypertrophy, and severe facet hypertrophy result in severe spinal stenosis and moderate bilateral neural foraminal stenosis. The facet joints are widened which may reflect underlying joint effusions and instability. L5-S1: Mild disc bulging and severe right and moderate left facet hypertrophy result in mild bilateral neural foraminal stenosis without significant spinal stenosis. IMPRESSION: 1. No acute osseous abnormality. 2. 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 3. Mild multifactorial spinal stenosis at L3-4. 4.  Aortic Atherosclerosis (ICD10-I70.0). Electronically Signed   By: Logan Bores M.D.   On: 10/14/2018 19:14     1930:  CT/XR as above, no acute findings. Karina Holland as ambulated with steady gait with her cane, resps easy, NAD. Tx symptomatically, f/u PMD. Dx and testing, as well as incidental finding(s), d/w Karina Holland and family.  Questions answered.  Verb understanding, agreeable to d/c home with outpt f/u.     Final Clinical Impressions(s) / ED Diagnoses   Final diagnoses:  None    ED Discharge Orders    None  Francine Graven, DO 10/17/18 1610

## 2019-07-27 ENCOUNTER — Other Ambulatory Visit: Payer: Self-pay

## 2019-07-27 ENCOUNTER — Emergency Department (HOSPITAL_COMMUNITY)
Admission: EM | Admit: 2019-07-27 | Discharge: 2019-07-28 | Disposition: A | Discharge status: Home / Self Care | Payer: Medicare Other | Attending: Emergency Medicine | Admitting: Emergency Medicine

## 2019-07-27 ENCOUNTER — Encounter (HOSPITAL_COMMUNITY): Payer: Self-pay | Admitting: Emergency Medicine

## 2019-07-27 DIAGNOSIS — R1012 Left upper quadrant pain: Secondary | ICD-10-CM | POA: Diagnosis not present

## 2019-07-27 DIAGNOSIS — Z79899 Other long term (current) drug therapy: Secondary | ICD-10-CM | POA: Insufficient documentation

## 2019-07-27 DIAGNOSIS — M25542 Pain in joints of left hand: Secondary | ICD-10-CM

## 2019-07-27 DIAGNOSIS — M25541 Pain in joints of right hand: Secondary | ICD-10-CM

## 2019-07-27 DIAGNOSIS — Z7982 Long term (current) use of aspirin: Secondary | ICD-10-CM | POA: Diagnosis not present

## 2019-07-27 DIAGNOSIS — R109 Unspecified abdominal pain: Secondary | ICD-10-CM | POA: Diagnosis present

## 2019-07-27 DIAGNOSIS — K219 Gastro-esophageal reflux disease without esophagitis: Secondary | ICD-10-CM | POA: Diagnosis not present

## 2019-07-27 DIAGNOSIS — Z87891 Personal history of nicotine dependence: Secondary | ICD-10-CM | POA: Diagnosis not present

## 2019-07-27 DIAGNOSIS — M79645 Pain in left finger(s): Secondary | ICD-10-CM | POA: Diagnosis not present

## 2019-07-27 DIAGNOSIS — M79644 Pain in right finger(s): Secondary | ICD-10-CM | POA: Diagnosis not present

## 2019-07-27 NOTE — ED Triage Notes (Signed)
Pt states she had the 2nd covid shot last week and since then her arthritis is worse and also c/o abd swelling.

## 2019-07-28 ENCOUNTER — Emergency Department (HOSPITAL_COMMUNITY): Payer: Medicare Other

## 2019-07-28 DIAGNOSIS — R1012 Left upper quadrant pain: Secondary | ICD-10-CM | POA: Diagnosis not present

## 2019-07-28 LAB — COMPREHENSIVE METABOLIC PANEL
ALT: 26 U/L (ref 0–44)
AST: 25 U/L (ref 15–41)
Albumin: 3.6 g/dL (ref 3.5–5.0)
Alkaline Phosphatase: 74 U/L (ref 38–126)
Anion gap: 9 (ref 5–15)
BUN: 12 mg/dL (ref 8–23)
CO2: 23 mmol/L (ref 22–32)
Calcium: 8.8 mg/dL — ABNORMAL LOW (ref 8.9–10.3)
Chloride: 106 mmol/L (ref 98–111)
Creatinine, Ser: 1.09 mg/dL — ABNORMAL HIGH (ref 0.44–1.00)
GFR calc Af Amer: 59 mL/min — ABNORMAL LOW (ref >60)
GFR calc non Af Amer: 51 mL/min — ABNORMAL LOW (ref >60)
Glucose, Bld: 134 mg/dL — ABNORMAL HIGH (ref 70–99)
Potassium: 3.7 mmol/L (ref 3.5–5.1)
Sodium: 138 mmol/L (ref 135–145)
Total Bilirubin: 0.4 mg/dL (ref 0.3–1.2)
Total Protein: 6.6 g/dL (ref 6.5–8.1)

## 2019-07-28 LAB — CBC WITH DIFFERENTIAL/PLATELET
Abs Immature Granulocytes: 0.02 10*3/uL (ref 0.00–0.07)
Basophils Absolute: 0 10*3/uL (ref 0.0–0.1)
Basophils Relative: 0 %
Eosinophils Absolute: 0.1 10*3/uL (ref 0.0–0.5)
Eosinophils Relative: 1 %
HCT: 41.2 % (ref 36.0–46.0)
Hemoglobin: 13.3 g/dL (ref 12.0–15.0)
Immature Granulocytes: 0 %
Lymphocytes Relative: 37 %
Lymphs Abs: 3 10*3/uL (ref 0.7–4.0)
MCH: 28.6 pg (ref 26.0–34.0)
MCHC: 32.3 g/dL (ref 30.0–36.0)
MCV: 88.6 fL (ref 80.0–100.0)
Monocytes Absolute: 0.5 10*3/uL (ref 0.1–1.0)
Monocytes Relative: 6 %
Neutro Abs: 4.5 10*3/uL (ref 1.7–7.7)
Neutrophils Relative %: 56 %
Platelets: 358 10*3/uL (ref 150–400)
RBC: 4.65 MIL/uL (ref 3.87–5.11)
RDW: 13.7 % (ref 11.5–15.5)
WBC: 8.1 10*3/uL (ref 4.0–10.5)
nRBC: 0 % (ref 0.0–0.2)

## 2019-07-28 LAB — LIPASE, BLOOD: Lipase: 39 U/L (ref 11–51)

## 2019-07-28 MED ORDER — DEXAMETHASONE SODIUM PHOSPHATE 10 MG/ML IJ SOLN
5.0000 mg | Freq: Once | INTRAMUSCULAR | Status: AC
  Administered 2019-07-28: 5 mg via INTRAVENOUS
  Filled 2019-07-28: qty 1

## 2019-07-28 MED ORDER — PREDNISONE 20 MG PO TABS: ORAL_TABLET | ORAL | 0 refills | Status: AC

## 2019-07-28 MED ORDER — IOHEXOL 300 MG/ML  SOLN
100.0000 mL | Freq: Once | INTRAMUSCULAR | Status: AC | PRN
  Administered 2019-07-28: 100 mL via INTRAVENOUS

## 2019-07-28 MED ORDER — OMEPRAZOLE 20 MG PO CPDR: DELAYED_RELEASE_CAPSULE | ORAL | 0 refills | Status: AC

## 2019-07-28 NOTE — ED Provider Notes (Signed)
[facility] EMERGENCY DEPARTMENT Provider Note   CSN: [id number] Arrival date & time: 07/27/19  1941     History Chief Complaint  Patient presents with  . Joint Pain    Karina Holland is a 74 y.o. female.  HPI      Karina Holland is a 74 y.o. female with past medical history of arthritis GERD and back pain who presents to the Emergency Department complaining of abdominal pain and swelling.  Symptoms have been persistent for 1 week.  Patient states she noticed the swelling in her abdomen after receiving her second Covid vaccine.  She states that her abdomen feels "hard" on the left side and has been associated with some nausea. She states that her abdomen feels swollen and tight after eating a heavy meal. She also reports pain and stiffness of her hands.  She associates this with her arthritis and states this is also been worse since the Covid vaccine.    She denies fever, constipation, vomiting, chest pain or shortness of breath.    Past Medical History:  Diagnosis Date  . Arthritis    osteoarthritis.  . Back pain   . Depression   . GERD (gastroesophageal reflux disease)   . Hypercholesteremia     Patient Active Problem List   Diagnosis Date Noted  . GERD 11/30/2009  . 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 WEIGHT LOSS, ABNORMAL 11/30/2009  . CHANGE IN BOWELS 11/30/2009  . ABDOMINAL PAIN, EPIGASTRIC 11/30/2009  . ADENOCARCINOMA, COLON, HX OF 11/30/2009    Past Surgical History:  Procedure Laterality Date  . ABDOMINAL HYSTERECTOMY    . BACK SURGERY    . CATARACT EXTRACTION Bilateral   . LIPOMA EXCISION Right 09/23/2015   Procedure: EXCISON RIGHT BUTTOCK SOFT TISSUE MASS;  Surgeon: Vickie Epley, MD;  Location: [facility];  Service: General;  Laterality: Right;  . NECK SURGERY       OB History   No obstetric history on file.     No family history on file.  Social History   Tobacco Use  . Smoking status: Former Smoker    Packs/day: 0.25    Years: 4.00    Pack years:  1.00    Types: Cigarettes    Quit date: 03/23/2015    Years since quitting: 4.3  . Smokeless tobacco: Never Used  Substance Use Topics  . Alcohol use: Yes    Comment: on weekends  . Drug use: No    Home Medications Prior to Admission medications   Medication Sig Start Date End Date Taking? Authorizing Provider  albuterol (PROVENTIL HFA;VENTOLIN HFA) 108 (90 Base) MCG/ACT inhaler Inhale 1-2 puffs into the lungs every 4 (four) hours as needed for wheezing or shortness of breath. 03/31/16   Clorene Nerio, PA-C  aspirin EC 81 MG tablet Take 81 mg by mouth daily.    [provider]  azithromycin (ZITHROMAX) 250 MG tablet 1 po daily with food Patient not taking: Reported on 06/08/2016 04/13/16   Lily Kocher, PA-C  benzonatate (TESSALON) 100 MG capsule Take 1 capsule (100 mg total) by mouth 3 (three) times daily as needed for cough. Patient not taking: Reported on 10/01/2016 06/08/16   Francine Graven, DO  dexamethasone (DECADRON) 4 MG tablet Take 1 tablet (4 mg total) by mouth 2 (two) times daily with a meal. Patient not taking: Reported on 10/01/2016 04/13/16   Lily Kocher, PA-C  guaiFENesin-codeine (ROBITUSSIN AC) 100-10 MG/5ML syrup Take 10 mLs by mouth 3 (three) times daily as needed.  Patient not taking: Reported on 06/08/2016 03/31/16   Kem Parkinson, PA-C  HYDROcodone-acetaminophen (NORCO/VICODIN) 5-325 MG tablet 1 or 2 tabs PO q12 hours prn pain 10/14/18   Francine Graven, DO  HYDROcodone-homatropine [facility]) 5-1.5 MG/5ML syrup Take 5 mLs by mouth every 6 (six) hours as needed. Patient not taking: Reported on 06/08/2016 04/13/16   Lily Kocher, PA-C  metroNIDAZOLE (FLAGYL) 500 MG tablet Take 1 tablet (500 mg total) by mouth 2 (two) times daily. 10/01/16   Noemi Chapel, MD  Multiple Vitamins-Minerals (ONE-A-DAY ENERGY PO) Take 1 tablet by mouth daily.      [provider]  omeprazole (PRILOSEC) 40 MG capsule Take 40 mg by mouth daily.  07/14/15   [provider]   oxyCODONE-acetaminophen (ROXICET) 5-325 MG tablet Take 1 tablet by mouth every 4 (four) hours as needed for severe pain. Patient not taking: Reported on 06/08/2016 09/23/15   Vickie Epley, MD  predniSONE (DELTASONE) 20 MG tablet Take 2 tablets (40 mg total) by mouth daily. For 5 days Patient not taking: Reported on 06/08/2016 03/31/16   Kieron Kantner, PA-C  sertraline (ZOLOFT) 50 MG tablet Take 1 tablet by mouth daily. 02/03/16   [provider]  simvastatin (ZOCOR) 10 MG tablet Take 10 mg by mouth daily at 6 PM.  12/13/14   [provider]  venlafaxine (EFFEXOR) 37.5 MG tablet Take 37.5 mg by mouth daily. 02/03/16   [provider]    Allergies    Patient has no known allergies.  Review of Systems   Review of Systems  Constitutional: Negative for appetite change, chills and fever.  Respiratory: Negative for shortness of breath.   Cardiovascular: Negative for chest pain.  Gastrointestinal: Positive for abdominal distention, abdominal pain and nausea. Negative for blood in stool, diarrhea and vomiting.  Genitourinary: Negative for decreased urine volume, difficulty urinating, dysuria and flank pain.  Musculoskeletal: Positive for arthralgias (bilateral hand pain). Negative for back pain and myalgias.  Skin: Negative for color change and rash.  Neurological: Negative for dizziness, weakness and numbness.  Hematological: Negative for adenopathy.    Physical Exam Updated Vital Signs BP (!) 151/100 (BP Location: Left Arm)   Pulse 97   Temp 98.4 F (36.9 C) (Oral)   Resp 18   Ht 5\' 6"  (1.676 m)   Wt 68 kg   SpO2 99%   BMI 24.21 kg/m   Physical Exam Vitals and nursing note reviewed.  Constitutional:      General: She is not in acute distress.    Appearance: Normal appearance. She is not ill-appearing.  HENT:     Head: Atraumatic.     Mouth/Throat:     Mouth: Mucous membranes are moist.  Cardiovascular:     Rate and Rhythm: Normal rate and regular  rhythm.     Pulses: Normal pulses.  Pulmonary:     Effort: Pulmonary effort is normal.     Breath sounds: Normal breath sounds.  Chest:     Chest wall: No tenderness.  Abdominal:     General: There is no distension.     Palpations: Abdomen is soft. There is no mass.     Tenderness: There is abdominal tenderness. There is no right CVA tenderness or left CVA tenderness.     Comments: Mild ttp of the mid to lower left abdomen.  No guarding or rebound tenderness, no distention.  Abdomen is soft.   Musculoskeletal:        General: No swelling. Normal range of  motion.     Cervical back: Normal range of motion.     Right lower leg: No edema.     Left lower leg: No edema.     Comments: Pt has full ROM of the fingers of both hands.  No edema, erythema or excessive warmth.    Skin:    General: Skin is warm.     Capillary Refill: Capillary refill takes less than 2 seconds.     Findings: No rash.  Neurological:     General: No focal deficit present.     Mental Status: She is alert.     ED Results / Procedures / Treatments   Labs (all labs ordered are listed, but only abnormal results are displayed) Labs Reviewed  COMPREHENSIVE METABOLIC PANEL - Abnormal; Notable for the following components:      Result Value   Glucose, Bld 134 (*)    Creatinine, Ser 1.09 (*)    Calcium 8.8 (*)    GFR calc non Af Amer 51 (*)    GFR calc Af Amer 59 (*)    All other components within normal limits  CBC WITH DIFFERENTIAL/PLATELET  LIPASE, BLOOD  URINALYSIS, ROUTINE W REFLEX MICROSCOPIC    EKG None  Radiology No results found.  Procedures Procedures (including critical care time)  Medications Ordered in ED Medications - No data to display  ED Course  I have reviewed the triage vital signs and the nursing notes.  Pertinent labs & imaging results that were available during my care of the patient were reviewed by me and considered in my medical decision making (see chart for details).      MDM Rules/Calculators/A&P                      Pt here with left abdominal pain, nausea and reported abdominal distention.  She is stating that her abdomen "feels hard."  I do not appreciate significant distention on my exam.  Reports sx's began after receiving her second covid vaccine.  She appears well and non-toxic.  I will obtain labs and CT abdomen and pelvis.  She has some mild tenderness to palpation of the left abdomen and this may reflect a diverticulitis vs possible constipation, doubt obstruction.  No concerning sx's for septic joint.    Completion of work up still pending, findings discussed with Dr. Rolland Porter who agrees to review CT abd/pelvis results.  I feel that she will likely be discharged home with antibiotics if her CT results show diverticulitis.    Final Clinical Impression(s) / ED Diagnoses Final diagnoses:  None    Rx / DC Orders ED Discharge Orders    None       Kem Parkinson, PA-C 07/28/19 0136    Rolland Porter, MD 07/28/19 [phone]

## 2019-07-28 NOTE — Discharge Instructions (Addendum)
Call your primary provider for recheck.  Take the medications as prescribed.  You will need to talk to your primary care doctor about long-term care of your arthritis.  Look at the information and try to follow the diet for acid reflux.  If you continue to have abdominal pain let your primary care doctor know.  They may want to do further testing to check you for gallstones although there were no obvious ones on your CT scan.  Return to the emergency department if you get fever, uncontrolled vomiting or pain.

## 2019-09-01 DEATH — deceased
